# Patient Record
Sex: Male | Born: 1955
Health system: Southern US, Community
[De-identification: ages and names within clinical notes are randomized; demographics above are authoritative.]

## PROBLEM LIST (undated history)

## (undated) DIAGNOSIS — I639 Cerebral infarction, unspecified: Secondary | ICD-10-CM

## (undated) DIAGNOSIS — E785 Hyperlipidemia, unspecified: Secondary | ICD-10-CM

## (undated) DIAGNOSIS — I219 Acute myocardial infarction, unspecified: Secondary | ICD-10-CM

## (undated) DIAGNOSIS — I10 Essential (primary) hypertension: Secondary | ICD-10-CM

## (undated) HISTORY — DX: Acute myocardial infarction, unspecified: I21.9

## (undated) HISTORY — DX: Hyperlipidemia, unspecified: E78.5

## (undated) HISTORY — DX: Essential (primary) hypertension: I10

---

## 2000-01-16 ENCOUNTER — Encounter: Admission: RE | Admit: 2000-01-16 | Discharge: 2000-01-16 | Payer: Self-pay | Admitting: Family Medicine

## 2000-01-31 ENCOUNTER — Encounter: Admission: RE | Admit: 2000-01-31 | Discharge: 2000-01-31 | Payer: Self-pay | Admitting: Family Medicine

## 2000-02-06 ENCOUNTER — Encounter: Admission: RE | Admit: 2000-02-06 | Discharge: 2000-02-06 | Payer: Self-pay | Admitting: Family Medicine

## 2000-07-13 ENCOUNTER — Encounter: Admission: RE | Admit: 2000-07-13 | Discharge: 2000-07-13 | Payer: Self-pay | Admitting: Family Medicine

## 2000-07-13 ENCOUNTER — Ambulatory Visit (HOSPITAL_COMMUNITY): Admission: RE | Admit: 2000-07-13 | Discharge: 2000-07-13 | Payer: Self-pay | Admitting: Family Medicine

## 2000-10-24 ENCOUNTER — Encounter: Admission: RE | Admit: 2000-10-24 | Discharge: 2000-10-24 | Payer: Self-pay | Admitting: Family Medicine

## 2000-10-26 ENCOUNTER — Encounter: Admission: RE | Admit: 2000-10-26 | Discharge: 2000-10-26 | Payer: Self-pay | Admitting: Family Medicine

## 2000-10-30 ENCOUNTER — Encounter: Admission: RE | Admit: 2000-10-30 | Discharge: 2001-01-28 | Payer: Self-pay | Admitting: *Deleted

## 2000-11-02 ENCOUNTER — Encounter: Admission: RE | Admit: 2000-11-02 | Discharge: 2000-11-02 | Payer: Self-pay | Admitting: Family Medicine

## 2000-11-09 ENCOUNTER — Encounter: Admission: RE | Admit: 2000-11-09 | Discharge: 2000-11-09 | Payer: Self-pay | Admitting: Family Medicine

## 2000-11-23 ENCOUNTER — Encounter: Admission: RE | Admit: 2000-11-23 | Discharge: 2000-11-23 | Payer: Self-pay | Admitting: Family Medicine

## 2000-12-20 ENCOUNTER — Encounter: Admission: RE | Admit: 2000-12-20 | Discharge: 2000-12-20 | Payer: Self-pay | Admitting: Family Medicine

## 2001-05-10 ENCOUNTER — Encounter: Admission: RE | Admit: 2001-05-10 | Discharge: 2001-05-10 | Payer: Self-pay | Admitting: Family Medicine

## 2001-05-31 ENCOUNTER — Encounter: Admission: RE | Admit: 2001-05-31 | Discharge: 2001-05-31 | Payer: Self-pay | Admitting: Family Medicine

## 2001-08-26 ENCOUNTER — Encounter: Admission: RE | Admit: 2001-08-26 | Discharge: 2001-08-26 | Payer: Self-pay | Admitting: *Deleted

## 2001-09-30 ENCOUNTER — Encounter: Admission: RE | Admit: 2001-09-30 | Discharge: 2001-09-30 | Payer: Self-pay | Admitting: Family Medicine

## 2001-10-03 ENCOUNTER — Encounter: Admission: RE | Admit: 2001-10-03 | Discharge: 2001-10-03 | Payer: Self-pay | Admitting: Family Medicine

## 2002-11-21 ENCOUNTER — Encounter: Admission: RE | Admit: 2002-11-21 | Discharge: 2002-11-21 | Payer: Self-pay | Admitting: Sports Medicine

## 2003-12-03 ENCOUNTER — Ambulatory Visit: Payer: Self-pay | Admitting: Family Medicine

## 2004-09-13 ENCOUNTER — Ambulatory Visit: Payer: Self-pay | Admitting: Sports Medicine

## 2005-01-03 ENCOUNTER — Ambulatory Visit: Payer: Self-pay | Admitting: Family Medicine

## 2005-01-26 ENCOUNTER — Ambulatory Visit: Payer: Self-pay | Admitting: Family Medicine

## 2005-10-25 ENCOUNTER — Ambulatory Visit: Payer: Self-pay | Admitting: Family Medicine

## 2006-04-19 DIAGNOSIS — E119 Type 2 diabetes mellitus without complications: Secondary | ICD-10-CM | POA: Insufficient documentation

## 2006-04-19 DIAGNOSIS — E669 Obesity, unspecified: Secondary | ICD-10-CM | POA: Insufficient documentation

## 2006-04-19 DIAGNOSIS — E785 Hyperlipidemia, unspecified: Secondary | ICD-10-CM | POA: Insufficient documentation

## 2006-04-19 DIAGNOSIS — I10 Essential (primary) hypertension: Secondary | ICD-10-CM | POA: Insufficient documentation

## 2006-05-24 ENCOUNTER — Telehealth (INDEPENDENT_AMBULATORY_CARE_PROVIDER_SITE_OTHER): Payer: Self-pay | Admitting: Family Medicine

## 2006-07-04 ENCOUNTER — Telehealth: Payer: Self-pay | Admitting: *Deleted

## 2006-07-26 ENCOUNTER — Ambulatory Visit: Payer: Self-pay | Admitting: Family Medicine

## 2006-07-26 ENCOUNTER — Encounter (INDEPENDENT_AMBULATORY_CARE_PROVIDER_SITE_OTHER): Payer: Self-pay | Admitting: Family Medicine

## 2006-07-26 LAB — CONVERTED CEMR LAB
Bilirubin Urine: NEGATIVE
Blood in Urine, dipstick: NEGATIVE
CO2: 24 meq/L (ref 19–32)
Calcium: 9.9 mg/dL (ref 8.4–10.5)
Creatinine, Ser: 1.22 mg/dL (ref 0.40–1.50)
Direct LDL: 83 mg/dL
Ketones, urine, test strip: NEGATIVE
Nitrite: NEGATIVE
Sodium: 140 meq/L (ref 135–145)
Specific Gravity, Urine: 1.025

## 2006-11-16 ENCOUNTER — Telehealth (INDEPENDENT_AMBULATORY_CARE_PROVIDER_SITE_OTHER): Payer: Self-pay | Admitting: Family Medicine

## 2006-11-20 ENCOUNTER — Telehealth: Payer: Self-pay | Admitting: *Deleted

## 2007-01-22 ENCOUNTER — Ambulatory Visit: Payer: Self-pay | Admitting: Family Medicine

## 2007-01-22 LAB — CONVERTED CEMR LAB
Cholesterol, target level: 200 mg/dL
Hgb A1c MFr Bld: 6.6 %
LDL Goal: 100 mg/dL

## 2007-07-06 ENCOUNTER — Telehealth (INDEPENDENT_AMBULATORY_CARE_PROVIDER_SITE_OTHER): Payer: Self-pay | Admitting: Family Medicine

## 2007-10-30 ENCOUNTER — Telehealth: Payer: Self-pay | Admitting: *Deleted

## 2007-11-27 ENCOUNTER — Ambulatory Visit: Payer: Self-pay | Admitting: Family Medicine

## 2007-11-27 ENCOUNTER — Encounter (INDEPENDENT_AMBULATORY_CARE_PROVIDER_SITE_OTHER): Payer: Self-pay | Admitting: *Deleted

## 2007-12-03 ENCOUNTER — Telehealth: Payer: Self-pay | Admitting: *Deleted

## 2007-12-06 ENCOUNTER — Telehealth: Payer: Self-pay | Admitting: Family Medicine

## 2008-02-11 ENCOUNTER — Telehealth: Payer: Self-pay | Admitting: *Deleted

## 2008-04-03 ENCOUNTER — Ambulatory Visit: Payer: Self-pay | Admitting: Family Medicine

## 2008-04-03 ENCOUNTER — Inpatient Hospital Stay (HOSPITAL_COMMUNITY): Admission: AD | Admit: 2008-04-03 | Discharge: 2008-04-06 | Payer: Self-pay | Admitting: Family Medicine

## 2008-04-03 ENCOUNTER — Encounter: Payer: Self-pay | Admitting: Family Medicine

## 2008-04-03 ENCOUNTER — Encounter: Payer: Self-pay | Admitting: Emergency Medicine

## 2008-04-03 ENCOUNTER — Ambulatory Visit: Payer: Self-pay | Admitting: Diagnostic Radiology

## 2008-04-06 ENCOUNTER — Encounter: Payer: Self-pay | Admitting: Family Medicine

## 2008-04-07 ENCOUNTER — Telehealth: Payer: Self-pay | Admitting: *Deleted

## 2008-04-16 ENCOUNTER — Ambulatory Visit: Payer: Self-pay | Admitting: Family Medicine

## 2008-04-16 ENCOUNTER — Encounter: Payer: Self-pay | Admitting: Family Medicine

## 2008-05-01 ENCOUNTER — Telehealth: Payer: Self-pay | Admitting: Family Medicine

## 2008-05-12 ENCOUNTER — Telehealth: Payer: Self-pay | Admitting: *Deleted

## 2008-05-28 ENCOUNTER — Ambulatory Visit: Payer: Self-pay | Admitting: Internal Medicine

## 2008-06-11 ENCOUNTER — Ambulatory Visit: Payer: Self-pay | Admitting: Internal Medicine

## 2008-12-10 ENCOUNTER — Telehealth: Payer: Self-pay | Admitting: Family Medicine

## 2009-07-02 DIAGNOSIS — E876 Hypokalemia: Secondary | ICD-10-CM | POA: Insufficient documentation

## 2009-07-13 ENCOUNTER — Ambulatory Visit: Payer: Self-pay | Admitting: Internal Medicine

## 2009-07-13 DIAGNOSIS — R9431 Abnormal electrocardiogram [ECG] [EKG]: Secondary | ICD-10-CM | POA: Insufficient documentation

## 2009-09-27 ENCOUNTER — Ambulatory Visit: Payer: Self-pay | Admitting: Family Medicine

## 2009-09-27 ENCOUNTER — Telehealth: Payer: Self-pay | Admitting: Family Medicine

## 2009-09-27 DIAGNOSIS — B029 Zoster without complications: Secondary | ICD-10-CM | POA: Insufficient documentation

## 2009-11-17 ENCOUNTER — Encounter (INDEPENDENT_AMBULATORY_CARE_PROVIDER_SITE_OTHER): Payer: Self-pay | Admitting: *Deleted

## 2010-03-22 NOTE — Assessment & Plan Note (Signed)
Summary: np6/ok per heather   Visit Type:  Initial Consult Primary Provider:  Bobby Rumpf  MD  CC:  meds for HTN.  History of Present Illness: Kenyatte is a 55 y/o male with a h/o HTN, HL and DM2 presents for help with management of BP.  In Feb 2010 was admittd with hypoglycemia. ECG showed mild ST elevation and cardiac markers were mildly elevated. Had cardiac cath which was totally normal.   Doing well. Active without any CP or SOB. BP last year 190/95. Has been aggressively exercising and dieting and lost over 30 pounds. BP was still up so got frustrated and stopped checking it regularly.  Compliant with his medications.  No edema, orthopnea, BNP. Does an hour of cardio nearly every day. Has farily good HR response.    Current Medications (verified): 1)  Bayer Childrens Aspirin 81 Mg Chew (Aspirin) .... Take 1 Tablet By Mouth Once A Day 2)  Lisinopril 40 Mg Tabs (Lisinopril) .... Take 2 Tablet By Mouth Once A Day 3)  Metformin Hcl 1000 Mg Tabs (Metformin Hcl) .... Take 1 Tablet By Mouth Twice A Day 4)  Metoprolol Tartrate 100 Mg Tabs (Metoprolol Tartrate) .... Take 1 Tablet By Mouth Twice A Day 5)  Mevacor 40 Mg Tabs (Lovastatin) .... Take 1 Tablet By Mouth Once A Day 6)  Hydrochlorothiazide 25 Mg Tab (Hydrochlorothiazide) .... Take 1 Tablet By Mouth Once A Day 7)  Norvasc 10 Mg Tabs (Amlodipine Besylate) .Marland Kitchen.. 1 Tab By Mouth Qday 8)  Niacin 500 Mg Tabs (Niacin) .... Once Daily 9)  Multivitamins   Tabs (Multiple Vitamin) .... Once Daily 10)  Fish Oil   Oil (Fish Oil) .... 3  Daily  Allergies (verified): No Known Drug Allergies  Past History:  Past Medical History: 1. HTN 2. HLD 3. Obesity 4. DM2 5. Hypokalemia. 6. Abnormal ECG    --cardiac cath. normal LV and normal cors  Family History: Reviewed history from 11/27/2007 and no changes required. Father died in his 51`s of kidney dz., Mother has HTN, Diabetes. No Hx of prostate CA, colon CA.   Social History: Reviewed  history from 07/02/2009 and no changes required. Works at [employer]  Tobacco Use - No.  Alcohol Use - no Drug Use - no  Review of Systems       As per HPI and past medical history; otherwise all systems negative.   Vital Signs:  Patient profile:   55 year old male Height:      69.5 inches Weight:      233 pounds BMI:     34.04 Pulse rate:   60 / minute BP sitting:   134 / 72  (left arm) Cuff size:   large  Vitals Entered By: Hardin Negus, RMA (Jul 13, 2009 3:53 PM)   Impression & Recommendations:  Problem # 1:  HYPERTENSION, BENIGN SYSTEMIC (ICD-401.1) BP fairly well controlled. We did discuss changing metoprolol to carvedilol to continue BP control with less chrontropic effect (and possibly better glucose handling) but he seems to be doing well so we will continue. I have asked him to check BP and HR at home 3-4x week and bring [place] a log in several months to review. Spironolactone also an option in future.  Problem # 2:  ABNORMAL EKG (ICD-794.31) Coronaries by cath. We made him a copy of his ECG to keep in his wallet.   Other Orders: EKG w/ Interpretation (93000)  Patient Instructions: 1)  Follow up in 3-4 months

## 2010-03-22 NOTE — Assessment & Plan Note (Signed)
Summary: rash   Vital Signs:  Patient profile:   55 year old male Height:      69.5 inches Weight:      235.2 pounds BMI:     34.36 Temp:     97.9 degrees F oral Pulse rate:   60 / minute BP sitting:   123 / 77  (left arm) Cuff size:   regular  Vitals Entered By: Garen Grams LPN (September 27, 2009 9:54 AM) CC: rash on right side Is Patient Diabetic? Yes Did you bring your meter with you today? No Pain Assessment Patient in pain? no        Primary Care Provider:  Bobby Rumpf  MD  CC:  rash on right side.  History of Present Illness: R sided rash: was on vacation last week when developed R sided back pain.  persisted as an ache then yesterday started with a rash on this right side.  no longer painful.  no fever.  otherwise feeling well.  Habits & Providers  Alcohol-Tobacco-Diet     Tobacco Status: never  Current Medications (verified): 1)  Bayer Childrens Aspirin 81 Mg Chew (Aspirin) .... Take 1 Tablet By Mouth Once A Day 2)  Lisinopril 40 Mg Tabs (Lisinopril) .... Take 2 Tablet By Mouth Once A Day 3)  Metformin Hcl 1000 Mg Tabs (Metformin Hcl) .... Take 1 Tablet By Mouth Twice A Day 4)  Metoprolol Tartrate 100 Mg Tabs (Metoprolol Tartrate) .... Take 1 Tablet By Mouth Twice A Day 5)  Mevacor 40 Mg Tabs (Lovastatin) .... Take 1 Tablet By Mouth Once A Day 6)  Hydrochlorothiazide 25 Mg Tab (Hydrochlorothiazide) .... Take 1 Tablet By Mouth Once A Day 7)  Norvasc 10 Mg Tabs (Amlodipine Besylate) .Marland Kitchen.. 1 Tab By Mouth Qday 8)  Niacin 500 Mg Tabs (Niacin) .... Once Daily 9)  Multivitamins   Tabs (Multiple Vitamin) .... Once Daily 10)  Fish Oil   Oil (Fish Oil) .... 3  Daily 11)  Acyclovir 200 Mg Caps (Acyclovir) .... 4 Tabs By Mouth 5 Times Daily For 7 Days.  Disp Qs  Allergies (verified): No Known Drug Allergies  Past History:  Past medical, surgical, family and social histories (including risk factors) reviewed for relevance to current acute and chronic problems.  Past  Medical History: 1. HTN 2. HLD 3. Obesity 4. DM2 5. Hypokalemia. 6. Abnormal ECG    --cardiac cath. normal LV and normal cors  shingles August 2011  Family History: Reviewed history from 11/27/2007 and no changes required. Father died in his 2`s of kidney dz., Mother has HTN, Diabetes. No Hx of prostate CA, colon CA.   Social History: Reviewed history from 07/02/2009 and no changes required. Works at [employer]  Tobacco Use - No.  Alcohol Use - no Drug Use - no  Review of Systems       per HPI  Physical Exam  General:  obese and pleasant AAM, NAD VS noted - WNL Skin:  vesicular rash in various stages on along likely T5 or T6 dermatome on right. nontender.    Impression & Recommendations:  Problem # 1:  SHINGLES (ICD-053.9) Assessment New  since not painful at this time no rx to start today however given rx in case it develops pain.  o/w advised to avoid children <1 yr of age if possible.   Orders: [facility]- Est Level  3 (16109)  Complete Medication List: 1)  Bayer Childrens Aspirin 81 Mg Chew (Aspirin) .... Take 1 tablet  by mouth once a day 2)  Lisinopril 40 Mg Tabs (Lisinopril) .... Take 2 tablet by mouth once a day 3)  Metformin Hcl 1000 Mg Tabs (Metformin hcl) .... Take 1 tablet by mouth twice a day 4)  Metoprolol Tartrate 100 Mg Tabs (Metoprolol tartrate) .... Take 1 tablet by mouth twice a day 5)  Mevacor 40 Mg Tabs (Lovastatin) .... Take 1 tablet by mouth once a day 6)  Hydrochlorothiazide 25 Mg Tab (Hydrochlorothiazide) .... Take 1 tablet by mouth once a day 7)  Norvasc 10 Mg Tabs (Amlodipine besylate) .Marland Kitchen.. 1 tab by mouth qday 8)  Niacin 500 Mg Tabs (Niacin) .... Once daily 9)  Multivitamins Tabs (Multiple vitamin) .... Once daily 10)  Fish Oil Oil (Fish oil) .... 3  daily 11)  Acyclovir 200 Mg Caps (Acyclovir) .... 4 tabs by mouth 5 times daily for 7 days.  disp qs  Patient Instructions: 1)  Please fill the prescription if you start getting  worsening pain on your rash or burning in the area.  Call us if you start this prescription.  Prescriptions: ACYCLOVIR 200 MG CAPS (ACYCLOVIR) 4 tabs by mouth 5 times daily for 7 days.  Disp QS  #QS x 0   Entered and Authorized by:   Ancil Boozer  MD   Signed by:   Ancil Boozer  MD on 09/27/2009   Method used:   Handwritten   RxID:   [id number]

## 2010-03-22 NOTE — Letter (Signed)
Summary: Appointment - Missed  [facility], Main Office  [address]   [place], [place] [address]   Phone: [phone]  Fax: [phone]     November 17, 2009 MRN: [id number]   DARCY CORDNER [address] [place], [place]  [address]   Dear Mr. Kobel,  Our records indicate you missed your appointment on   11-11-2009 with  Dr.   Gala Romney   It is very important that we reach you to reschedule this appointment. We look forward to participating in your health care needs. Please contact us at the number listed above at your earliest convenience to reschedule this appointment.     Sincerely,      Lorne Skeens  [facility] Scheduling Team

## 2010-03-22 NOTE — Progress Notes (Signed)
Summary: triage  Phone Note Call from Patient Call back at Home Phone [phone]   Caller: Patient Summary of Call: just came back from trip and found rash all over - not sure if it's bedbugs and needs to talk to nurse Initial call taken by: De Nurse,  September 27, 2009 8:47 AM  Follow-up for Phone Call        he will see Dr. Sandi Mealy at 9:30 Follow-up by: Golden Circle RN,  September 27, 2009 8:48 AM

## 2010-04-19 ENCOUNTER — Other Ambulatory Visit: Payer: Self-pay | Admitting: Family Medicine

## 2010-04-20 NOTE — Telephone Encounter (Signed)
Please review and refill

## 2010-04-21 NOTE — Telephone Encounter (Signed)
Please review and refill for this patient Dr.Neal's.

## 2010-06-01 LAB — GLUCOSE, CAPILLARY: Glucose-Capillary: 89 mg/dL (ref 70–99)

## 2010-06-07 LAB — GLUCOSE, CAPILLARY
Glucose-Capillary: 111 mg/dL — ABNORMAL HIGH (ref 70–99)
Glucose-Capillary: 113 mg/dL — ABNORMAL HIGH (ref 70–99)
Glucose-Capillary: 116 mg/dL — ABNORMAL HIGH (ref 70–99)
Glucose-Capillary: 130 mg/dL — ABNORMAL HIGH (ref 70–99)
Glucose-Capillary: 140 mg/dL — ABNORMAL HIGH (ref 70–99)
Glucose-Capillary: 15 mg/dL — CL (ref 70–99)
Glucose-Capillary: 152 mg/dL — ABNORMAL HIGH (ref 70–99)
Glucose-Capillary: 193 mg/dL — ABNORMAL HIGH (ref 70–99)
Glucose-Capillary: 73 mg/dL (ref 70–99)
Glucose-Capillary: 56 mg/dL — ABNORMAL LOW (ref 70–99)
Glucose-Capillary: 66 mg/dL — ABNORMAL LOW (ref 70–99)

## 2010-06-07 LAB — COMPREHENSIVE METABOLIC PANEL
ALT: 68 U/L — ABNORMAL HIGH (ref 0–53)
AST: 25 U/L (ref 0–37)
Albumin: 3.4 g/dL — ABNORMAL LOW (ref 3.5–5.2)
Alkaline Phosphatase: 65 U/L (ref 39–117)
Alkaline Phosphatase: 74 U/L (ref 39–117)
BUN: 10 mg/dL (ref 6–23)
BUN: 8 mg/dL (ref 6–23)
BUN: 14 mg/dL (ref 6–23)
CO2: 31 mEq/L (ref 19–32)
CO2: 31 mEq/L (ref 19–32)
Calcium: 9.1 mg/dL (ref 8.4–10.5)
Calcium: 9.6 mg/dL (ref 8.4–10.5)
Creatinine, Ser: 1.17 mg/dL (ref 0.4–1.5)
GFR calc non Af Amer: >60 mL/min (ref >60)
Glucose, Bld: 111 mg/dL — ABNORMAL HIGH (ref 70–99)
Glucose, Bld: 172 mg/dL — ABNORMAL HIGH (ref 70–99)
Glucose, Bld: 68 mg/dL — ABNORMAL LOW (ref 70–99)
Sodium: 140 mEq/L (ref 135–145)
Sodium: 141 mEq/L (ref 135–145)
Total Bilirubin: 0.5 mg/dL (ref 0.3–1.2)
Total Bilirubin: 0.6 mg/dL (ref 0.3–1.2)
Total Protein: 6.2 g/dL (ref 6.0–8.3)
Total Protein: 6.5 g/dL (ref 6.0–8.3)

## 2010-06-07 LAB — CARDIAC PANEL(CRET KIN+CKTOT+MB+TROPI)
CK, MB: 3 ng/mL (ref 0.3–4.0)
CK, MB: 4.5 ng/mL — ABNORMAL HIGH (ref 0.3–4.0)
Relative Index: 1.2 (ref 0.0–2.5)
Total CK: 299 U/L — ABNORMAL HIGH (ref 7–232)
Total CK: 323 U/L — ABNORMAL HIGH (ref 7–232)
Troponin I: 0.21 ng/mL — ABNORMAL HIGH (ref 0.00–0.06)
Troponin I: 0.22 ng/mL — ABNORMAL HIGH (ref 0.00–0.06)

## 2010-06-07 LAB — URINE CULTURE

## 2010-06-07 LAB — BASIC METABOLIC PANEL
BUN: 15 mg/dL (ref 6–23)
CO2: 25 mEq/L (ref 19–32)
GFR calc non Af Amer: 57 mL/min — ABNORMAL LOW (ref >60)
Glucose, Bld: 118 mg/dL — ABNORMAL HIGH (ref 70–99)
Potassium: 3.4 mEq/L — ABNORMAL LOW (ref 3.5–5.1)
Sodium: 140 mEq/L (ref 135–145)

## 2010-06-07 LAB — CBC
HCT: 38.6 % — ABNORMAL LOW (ref 39.0–52.0)
HCT: 40.1 % (ref 39.0–52.0)
HCT: 40.3 % (ref 39.0–52.0)
HCT: 40.9 % (ref 39.0–52.0)
HCT: 43.5 % (ref 39.0–52.0)
Hemoglobin: 13.2 g/dL (ref 13.0–17.0)
Hemoglobin: 13.4 g/dL (ref 13.0–17.0)
Hemoglobin: 13.7 g/dL (ref 13.0–17.0)
Hemoglobin: 14 g/dL (ref 13.0–17.0)
MCHC: 33.5 g/dL (ref 30.0–36.0)
MCHC: 33.5 g/dL (ref 30.0–36.0)
MCHC: 33.6 g/dL (ref 30.0–36.0)
MCHC: 34.1 g/dL (ref 30.0–36.0)
MCHC: 32.2 g/dL (ref 30.0–36.0)
MCV: 79.4 fL (ref 78.0–100.0)
MCV: 79.7 fL (ref 78.0–100.0)
MCV: 79.9 fL (ref 78.0–100.0)
MCV: 80.2 fL (ref 78.0–100.0)
Platelets: 246 10*3/uL (ref 150–400)
Platelets: 248 10*3/uL (ref 150–400)
Platelets: 266 10*3/uL (ref 150–400)
RBC: 4.85 MIL/uL (ref 4.22–5.81)
RBC: 5.05 MIL/uL (ref 4.22–5.81)
RBC: 5.46 MIL/uL (ref 4.22–5.81)
RDW: 14.7 % (ref 11.5–15.5)
RDW: 15 % (ref 11.5–15.5)
RDW: 15 % (ref 11.5–15.5)
RDW: 15.3 % (ref 11.5–15.5)
RDW: 14.2 % (ref 11.5–15.5)
WBC: 5.6 10*3/uL (ref 4.0–10.5)

## 2010-06-07 LAB — CROSSMATCH: ABO/RH(D): B POS

## 2010-06-07 LAB — LIPID PANEL
Cholesterol: 131 mg/dL (ref 0–200)
LDL Cholesterol: 88 mg/dL (ref 0–99)

## 2010-06-07 LAB — POCT CARDIAC MARKERS
CKMB, poc: 6.4 ng/mL (ref 1.0–8.0)
Myoglobin, poc: 307 ng/mL (ref 12–200)

## 2010-06-07 LAB — URINALYSIS, ROUTINE W REFLEX MICROSCOPIC
Nitrite: NEGATIVE
Protein, ur: NEGATIVE mg/dL
Specific Gravity, Urine: 1.013 (ref 1.005–1.030)
Urobilinogen, UA: 1 mg/dL (ref 0.0–1.0)

## 2010-06-07 LAB — APTT: aPTT: 30 seconds (ref 24–37)

## 2010-06-07 LAB — HEMOGLOBIN A1C
Hgb A1c MFr Bld: 6.5 % — ABNORMAL HIGH (ref 4.6–6.1)
Mean Plasma Glucose: 140 mg/dL

## 2010-06-07 LAB — DIFFERENTIAL
Basophils Relative: 2 % — ABNORMAL HIGH (ref 0–1)
Eosinophils Absolute: 0.1 10*3/uL (ref 0.0–0.7)
Lymphs Abs: 1.4 10*3/uL (ref 0.7–4.0)
Neutro Abs: 3.9 10*3/uL (ref 1.7–7.7)
Neutrophils Relative %: 67 % (ref 43–77)

## 2010-07-05 ENCOUNTER — Other Ambulatory Visit: Payer: Self-pay | Admitting: Family Medicine

## 2010-07-05 NOTE — Discharge Summary (Signed)
Juan Kelly, Juan Kelly                 ACCOUNT NO.:  [id number]   MEDICAL RECORD NO.:  [id number]          PATIENT TYPE:  INP   LOCATION:  3713                         FACILITY:  [facility]   PHYSICIAN:  Santiago Bumpers. Hensel, M.D.DATE OF BIRTH:  Jan 21, 1956   DATE OF ADMISSION:  04/03/2008  DATE OF DISCHARGE:  04/06/2008                               DISCHARGE SUMMARY   PRIMARY CARE PHYSICIAN:  Bobby Rumpf, MD, at [facility].   DISCHARGE DIAGNOSES:  1. Hypoglycemia.  2. Non-ST-elevation myocardial infarction.  3. Rectal, subconjunctival bleeding.  4. Hypertension.  5. Diabetes mellitus type 2.  6. Hyperlipidemia.  7. Hypokalemia.   DISCHARGE MEDICATIONS:  1. Metformin 1000 mg p.o. b.i.d.  2. Norvasc 10 mg p.o. daily.  3. Niacin 500 mg p.o. nightly.  4. Zocor 40 mg p.o. nightly.  5. Hydrochlorothiazide 12.5 mg p.o. daily.  6. Lisinopril 20 mg p.o. daily.  7. Aspirin 325 mg p.o. daily.  8. Metoprolol 100 mg p.o. b.i.d.   DISCONTINUED MEDICATIONS:  Glyburide discontinued secondary to  hypoglycemic event.   CONSULTS:  Antonieta Iba, MD, at [facility].   LABORATORY DATA:  Labs on admission are as follows:  CBC with  differential within normal limits.  Electrolytes within normal limits.  AST 47, ALT 68, total protein 7.9, albumin 4.3, calcium 9.6.  Urinalysis  within normal limits with negative urine culture.  Initial hemoglobin  was 14.0 on admission.  Labs during admission, hemoglobin A1c 6.5.  Troponin I trended from 0.21 to 0.17 to 0.24 to 0.22.  CK-MB trended  from 6.0 to 4.5 to 3.4 to 3.0.  CK trended from 521 to 394 to 323 to  299.  TSH was within normal limits at 0.649.  Total cholesterol 131,  triglycerides 85, HDL 25, LDL 88.  Electrolytes on discharge were within  normal limits except for potassium of 3.4.  CBGs trended from 73 on  admission to a low of 56 to a high of 163 during the course of the  patient's  admission.   STUDIES:  1. Chest x-ray on April 03, 2008, showed no acute findings.  Mild      cardiac enlargement and unfolded aorta consistent with history of      hypertension.  2. CT head without contrast on April 03, 2008, showed no acute      intracranial abnormality, small old lacunar infarct in left      putamen.   PROCEDURES:  1. Cardiac catheterization on April 06, 2008, showed normal left      ventricular function, ejection fraction of 60%, normal coronary      arteries.  2. A 2-D echocardiogram from April 06, 2008, showed overall left      ventricular systolic function normal, left ventricular ejection      fraction estimated to be 60%.  No wall motion abnormalities.  Left      ventricular wall thickness markedly increased.  Increased relative      contribution of atrial contraction to left ventricular filling.      Thought  the parameters consistent with abnormal left ventricular      relaxation.  Aortic valve thickness mildly increased.  Left atrium      mildly dilated.   BRIEF SYNOPSIS:  This is a 55 year old with history of hypertension,  hyperlipidemia, and diabetes mellitus type 2 who presented to the  emergency room with hypoglycemia and altered mental status, as well as  elevated troponin with EKG changes without chest pain.  The patient  presented asymptomatic.  1. Elevated troponins with EKG changes.  The patient completely      asymptomatic on presentation and for hospital course.  The patient      with elevated troponins as described above.  The patient with      significant factors including diabetes mellitus type 2, history of      hyperlipidemia, history of hypertension.  The patient was initially      seen by Cardiology.  The patient was continued on beta-blocker,      started on full-dose Lovenox and had his aspirin increased to 325      mg daily.  Norvasc was also added for blood pressure control.  The      patient was also started on  statin.  The patient with EKG initially      with nonspecific T-wave changes in leads A, 1, and aVL; however, on      April 04, 2008, developed ST-segment elevation 1-2 mm in lateral      leads which was new.  Given these changes and elevated troponins,      there was concern for ACS.  Cardiology was reconsulted and decision      was made to start the patient on Integrilin prior to performing      cath.  The patient developed some mucosal bleeding with Integrilin      and this was stopped.  The patient was started on low-dose heparin      which was then held for cardiac cath.  The patient had cardiac cath      as described above which was negative, and the patient was then      discharged post-cardiac catheterization with plan to continue      primary prevention.  It was felt that the patient's elevated      troponins and ST changes were secondary to hypoglycemia.  2. Hypoglycemia.  The patient initially presented with blood glucose      as described above.  The patient was on glyburide in the outpatient      setting as well as metformin.  The patient's glyburide was held and      will be stopped prior to discharge.  The patient's A1c is as above.      The patient's CBGs gradually normalized, and the patient was      continued on metformin initially during the course of his      hospitalization.  The patient's metformin was then held prior to      starting cardiac catheterization.  The patient's metformin will be      restarted on discharge.  3. Hypertension.  The patient was continued on his home dose      metoprolol during the course of his hospitalization.  The patient's      hydrochlorothiazide and lisinopril were initially held with plan      for cardiac catheterization.  These will be restarted on discharge.      The patient was started on Norvasc during the course of  hospitalization initially at 5 mg p.o. daily, this was then      increased to 10 mg p.o. daily.  The patient  is normotensive on      discharge.  4. Diabetes mellitus type 2.  The patient admitted for hypoglycemia as      described above.  The patient's glyburide will be held on      discharge.  The patient's CBGs normalized as described above.  The      patient will be discharged home on metformin.  The patient's A1c as      described above.  5. Hyperlipidemia.  The patient will be discharged on Zocor and niacin      given his fasting lipid panel as above.  The patient will be      discharged to home in stable and improved condition.  The patient      will follow up with Dr. Wallene Huh at [facility] on      April 16, 2008, at 4:00 p.m.      Bobby Rumpf, MD  Electronically Signed      Santiago Bumpers. Leveda Anna, M.D.  Electronically Signed    KC/MEDQ  D:  04/07/2008  T:  04/08/2008  Job:  [id number]

## 2010-07-05 NOTE — Cardiovascular Report (Signed)
[name], [name]                 ACCOUNT NO.:  [id number]   MEDICAL RECORD NO.:  [id number]          PATIENT TYPE:  INP   LOCATION:  3713                         FACILITY:  [facility]   PHYSICIAN:  Cristy Hilts. Jacinto Halim, MD       DATE OF BIRTH:  [birth year]   DATE OF PROCEDURE:  04/06/2008  DATE OF DISCHARGE:  04/06/2008                            CARDIAC CATHETERIZATION   PROCEDURE PERFORMED:  1. Left ventriculography.  2. Selective right and left coronary arteriography.  3. Abdominal aortogram.   INDICATIONS:  Mr. Juan Kelly who is a 55 year old gentleman with  hypertension, uncontrolled diabetes, hyperlipidemia, and obesity, who  was admitted to the hospital with confusional state.  He was found to  have markedly abnormal EKG with transient ST-segment elevations.  The  cardiac markers were positive for myocardial injury.  He is now brought  to the cardiac catheterization lab to reevaluate his coronary anatomy.   HEMODYNAMIC DATA:  The left ventricular pressure was 164/4 with an end-  diastolic pressure of 15 mmHg.  The aortic pressure was 159/90 with a  mean of 119 mmHg.  There was no pressure gradient across the aortic  valve.   ANGIOGRAPHIC DATA:  Left ventricle:  Left ventricular systolic function  was normal with an ejection fraction of 55-60% with no regional wall  motion abnormality.  No significant mitral regurgitation.   Right coronary artery:  The right coronary artery is a large dominant  vessel.  It is smooth and normal.  It gives rise to a large PDA and  large PLF.   Left main coronary artery:  Left main coronary artery is large caliber  vessel. Normal.  Circumflex:  Circumflex artery is a large caliber vessel that gives rise  to a large obtuse marginal.  It is smooth and normal.   LAD:  LAD is a large caliber vessel wraps distally to apex.  It gives  rise to two moderate-to-large-sized diagonals.  It is smooth and normal.   Abdominal aortogram.  Abdominal aortogram  revealed presence of two renal  arteries, one on either side.  They were widely patent.  There was no  evidence of abdominal aortic aneurysm.   The aortoiliac bifurcation was widely patent.   IMPRESSION AND PLAN:  Mr. Juan Kelly is a 55 year old gentleman with  hypertension with positive cardiac markers, abnormal EKG.  I suspect  this is not related to hypertension with hypertensive heart disease .  He has normal coronary arteries by cardiac catheterization.  We will  continue with primary prevention.  A total of 95 mL of contrast was  utilized for diagnostic angiography.   TECHNIQUE OF PROCEDURE:  Under usual sterile precautions using a 6-  French right femoral arterial access, 6-French multipurpose B2 catheter  was advanced into the ascending aorta and then to the left ventricle.  Left ventriculography was performed both in LAO and RAO projection.  Catheter pulled into the ascending aorta.  Right  coronary artery selectively engaged and angiography was performed.  The  catheter was then pulled back to abdominal aorta and abdominal aortogram  was  performed.  Catheter pulled out of body.  The patient tolerated the  procedure well.      Cristy Hilts. Jacinto Halim, MD  Electronically Signed     JRG/MEDQ  D:  04/06/2008  T:  04/07/2008  Job:  [id number]

## 2010-07-05 NOTE — H&P (Signed)
NAMESTEWARD, SAMES                 ACCOUNT NO.:  [id number]   MEDICAL RECORD NO.:  [id number]          PATIENT TYPE:  INP   LOCATION:  3715                         FACILITY:  [facility]   PHYSICIAN:  Pearlean Brownie, M.D.DATE OF BIRTH:  [birth year]   DATE OF ADMISSION:  04/03/2008  DATE OF DISCHARGE:                              HISTORY & PHYSICAL   PRIMARY CARE Kambrey Hagger:  Bobby Rumpf, MD   CHIEF COMPLAINT:  Elevated troponins with EKG changes.   HISTORY OF PRESENT ILLNESS:  This is a 55 year old African American male  with hypertension, hyperlipidemia, diabetes, who we are asked to admit  as a direct transfer from [facility] ED for elevated troponin  with EKG changes.  The patient initially went to the ED this morning for  altered mental status.  He reports his CBGs were in the 30s last night  prior to bed but he felt fine and they came up usually easily with food.  He woke this morning feeling normal and was taking to his friend on the  phone when he started to have slurred speech that was not making sense.  The friend called back and spoke to a family member who advised that he  be taken to the emergency department to be checked out further.  The  patient did check a CBG at that time but he ate breakfast and felt at  his baseline.  In the emergency department, his initial CBG was 79 but  it did continue to drop into the 50s and 60s periodically but he was not  having any altered mental status or slurred speech at that time and the  neuro exam was completely normal in the emergency department.  The  patient denied any chest pain or shortness of breath but was noted to be  hypertensive to the 190s systolic and point of cares were drawn that  showed troponin elevated at 0.12 with elevated myoglobin.  EKG showed  some nonspecific T-wave changes in lead I and aVL.  The ED physician  contacted Cardiology on call and was advised to admit the patient for  cardiac rule out and  obtain an echo but due to the hyperglycemia, they  advised we admit and consult them.   Upon arrival to [facility], the patient reports he has been  neurologically intact since that one episode this morning.  He said he  did not even notice anything wrong at that time as above.  He felt  coherent and normal throughout without any weakness or numbness.  He has  felt normal without slurred speech all day long.  He has been exercising  and on a strict diet for the last month with a 10-pound weight loss with  no changes to his diabetic medications.  He has not followed up in  clinic since last fall.  He denies any chest pain, shortness of breath,  dyspnea on exertion, peripheral edema or orthopnea.  He exercises 1 hour  daily of cardio without any of these symptoms either.  He denies  syncope.  He has no cardiac issue  or history.  Of note, he reports not  taking any antihypertensives this a.m.   REVIEW OF SYSTEMS:  Negative except for above.   PAST MEDICAL HISTORY:  Significant for:  1. Diabetes.  2. Hypertension.  3. Hyperlipidemia.  4. Obesity.   ALLERGIES:  None.   MEDICATIONS:  1. Aspirin 81 mg p.o. daily.  2. Glyburide 5 mg one b.i.d.  3. Lisinopril 40 mg 2 tablets daily.  4. Metformin 1000 mg p.o. b.i.d.  5. Metoprolol 100 mg 2 tablets p.o. b.i.d.  6. Mevacor 40 mg 1 tablet daily.  7. Hydrochlorothiazide 25 mg 1 tablet daily.   SOCIAL HISTORY:  The patient works in Engineer, drilling.  He does not  smoke, does not drink alcohol and denies illicit drugs.  He is single.   FAMILY HISTORY:  Father died at his 52s of kidney disease.  Mother has  hypertension and diabetes.  He has no history of prostate cancer or  colon cancer in the family.   PHYSICAL EXAMINATION:  VITAL SIGNS:  Temperature is 97.6.  Blood  pressure is 157/97, respirations 20, pulse is 63.  He is 100% on room  air.  GENERAL:  This is an obese and pleasant African American male in no  apparent  distress sitting up in bed eating dinner.  HEENT:  Normocephalic, atraumatic.  Pupils equal, round and reactive to  light.  Extraocular movements intact.  Moist mucous membranes.  NECK:  Supple.  Full range of motion.  No masses.  No JVD.  LUNGS:  Normal respiratory effort.  Clear to auscultation bilaterally.  No wheezes, rhonchi or crackles.  CARDIOVASCULAR:  Regular rate and rhythm.  No murmurs, rubs or gallops.  2+ radial and dorsalis pedis pulses bilaterally.  ABDOMEN:  Positive  bowel sounds, soft, nontender, nondistended.  EXTREMITIES:  No edema.  NEURO:  Alert and oriented x3.  Cranial nerves II-XII intact.  Strength  5/5 in all extremities.  Sensation in all extremities intact.  Normal  gait.  DTRs symmetric and normal.  SKIN:  Good turgor and no rashes.   LABORATORY DATA:  Urinalysis negative and a culture is currently  pending.  White blood cell count is 5.8, hemoglobin 14.0, and platelets  275.  Sodium 141, potassium 3.5, chloride 101, bicarb 31, BUN 16,  creatinine 1.2, glucose 68, ALT 60, AST 47 and total protein 7.9.  Point-  of-care enzymes showed a troponin of 0.12 on the first set and 0.10 on  the second set.  Head CT showed a small old lacunar infarct of the left  putamen but nothing acute.  EKG showed some nonspecific T-wave changes  compared to prior.   ASSESSMENT AND PLAN:  This is a 55 year old pleasant obese African  American male with hypertension, hyperlipidemia, type 2 diabetes with  hypoglycemia and elevated troponins with nonspecific EKG changes.  1. Hypoglycemia.  This is likely the cause of the garbled speech this      morning as it is completely resolved with restoration of CBGs.      Hypoglycemia from glyburide on a new diet, exercise program without      follow up with the primary care Miguel Medal is likely the cause.  We      will hold glyburide and his metformin pending further cardiac      workup and follow his CBG.  He will need sliding-scale  insulin      p.r.n. and likely need a change of the glyburide dose versus stop  it completely at discharge.  2. Cardiac totally asymptomatic.  Cardiology is still being consulted      for elevated troponins but I think this is likely secondary to the      hypertension that was catecholamine response from the prolonged      hypoglycemia.  Still he has significant risk factors so further      testing is warranted.  We will place him on telemetry, cycle his      enzymes and then obtain a 2-D echo.  Start him on aspirin and beta      blocker and follow up Cardiology recommendations.  3. Hypertension is elevated currently but likely related to a stress      response from the hypoglycemia.  He reports not taking his blood      pressure medications.  Today, we will continue his      hydrochlorothiazide and his beta blocker but hold the lisinopril in      case he needs further cardiac workup and diet studies.  Could      consider using a calcium channel blocker p.r.n.  4. Fluid, electrolytes and nutrition, GI: Hep-Lock IV, diabetic heart-      healthy diet and follow his electrolytes.  5. Prophylaxis, diet and Lovenox.   DISPOSITION:  Pending Cardiology's workup and resolution of a normal  CBG.      Lupita Raider, M.D.  Electronically Signed      Pearlean Brownie, M.D.  Electronically Signed    KS/MEDQ  D:  04/03/2008  T:  04/04/2008  Job:  [id number]

## 2010-07-06 ENCOUNTER — Other Ambulatory Visit: Payer: Self-pay | Admitting: Family Medicine

## 2010-07-06 NOTE — Telephone Encounter (Signed)
Refill request

## 2010-12-22 ENCOUNTER — Other Ambulatory Visit: Payer: Self-pay | Admitting: Family Medicine

## 2010-12-22 MED ORDER — METFORMIN HCL 1000 MG PO TABS: 1000.0000 mg | ORAL_TABLET | Freq: Two times a day (BID) | ORAL | Status: DC

## 2010-12-22 MED ORDER — LISINOPRIL 40 MG PO TABS: 40.0000 mg | ORAL_TABLET | Freq: Every day | ORAL | Status: DC

## 2011-01-26 ENCOUNTER — Other Ambulatory Visit: Payer: Self-pay | Admitting: Family Medicine

## 2011-01-26 MED ORDER — HYDROCHLOROTHIAZIDE 25 MG PO TABS: 25.0000 mg | ORAL_TABLET | Freq: Every day | ORAL | Status: DC

## 2011-01-31 ENCOUNTER — Other Ambulatory Visit: Payer: Self-pay | Admitting: Family Medicine

## 2011-01-31 MED ORDER — LISINOPRIL 40 MG PO TABS: 40.0000 mg | ORAL_TABLET | Freq: Every day | ORAL | Status: DC

## 2011-02-23 ENCOUNTER — Ambulatory Visit: Payer: Self-pay | Admitting: Family Medicine

## 2011-03-07 ENCOUNTER — Ambulatory Visit: Payer: Self-pay | Admitting: Family Medicine

## 2011-03-21 ENCOUNTER — Ambulatory Visit: Payer: Self-pay | Admitting: Family Medicine

## 2011-03-31 ENCOUNTER — Encounter: Payer: Self-pay | Admitting: Family Medicine

## 2011-03-31 ENCOUNTER — Ambulatory Visit (INDEPENDENT_AMBULATORY_CARE_PROVIDER_SITE_OTHER): Payer: Self-pay | Admitting: Family Medicine

## 2011-03-31 VITALS — BP 148/76 | HR 73 | Temp 99.1°F | Ht 69.5 in | Wt 263.0 lb

## 2011-03-31 DIAGNOSIS — E785 Hyperlipidemia, unspecified: Secondary | ICD-10-CM

## 2011-03-31 DIAGNOSIS — Z23 Encounter for immunization: Secondary | ICD-10-CM

## 2011-03-31 DIAGNOSIS — E119 Type 2 diabetes mellitus without complications: Secondary | ICD-10-CM

## 2011-03-31 LAB — POCT GLYCOSYLATED HEMOGLOBIN (HGB A1C): Hemoglobin A1C: 7.3

## 2011-03-31 MED ORDER — METFORMIN HCL 1000 MG PO TABS: 1000.0000 mg | ORAL_TABLET | Freq: Two times a day (BID) | ORAL | Status: DC

## 2011-03-31 MED ORDER — HYDROCHLOROTHIAZIDE 25 MG PO TABS: 25.0000 mg | ORAL_TABLET | Freq: Every day | ORAL | Status: DC

## 2011-03-31 MED ORDER — METOPROLOL TARTRATE 100 MG PO TABS: 100.0000 mg | ORAL_TABLET | Freq: Two times a day (BID) | ORAL | Status: DC

## 2011-03-31 MED ORDER — NIACIN ER 500 MG PO CPCR: 500.0000 mg | ORAL_CAPSULE | Freq: Every day | ORAL | Status: AC

## 2011-03-31 MED ORDER — LISINOPRIL 40 MG PO TABS: 40.0000 mg | ORAL_TABLET | Freq: Every day | ORAL | Status: DC

## 2011-03-31 MED ORDER — AMLODIPINE BESYLATE 10 MG PO TABS: 10.0000 mg | ORAL_TABLET | Freq: Every day | ORAL | Status: DC

## 2011-03-31 NOTE — Patient Instructions (Signed)
I will call you with the results of your lab work I will likely have to call in the cholesterol medicine. Please come back and see me in 3 months Please make appointment to see your eye doctor.

## 2011-03-31 NOTE — Progress Notes (Signed)
Addended by: Garen Grams F on: 03/31/2011 10:54 AM   Modules accepted: Orders

## 2011-03-31 NOTE — Progress Notes (Signed)
  Subjective:    Patient ID: Juan Kelly, male    DOB: 03-Nov-1955, 56 y.o.   MRN: [id number]  HPI  HYPERTENSION Disease Monitoring Blood pressure range-was 120/80 when he was on the lisinopril per patient Chest pain- none      Dyspnea- none Medications Compliance- taking all except lisinopril because this was not filled when he did not follow his appointment schedule Lightheadedness- none   Edema- none   DIABETES Disease Monitoring Blood Sugar ranges-rarely checks but they're usually 110 random blood sugar Polyuria- none New Visual problems- none patient saw his eye doctor 3 months ago. Medications Compliance- taking metformin once a day Hypoglycemic symptoms- none   HYPERLIPIDEMIA Disease Monitoring See symptoms for Hypertension Medications Compliance- not taking statin RUQ pain- none  Muscle aches- none  ROS See HPI above   PMH Smoking Status noted    Review of Systems No diarrhea, constipation    Objective:   Physical Exam Vital signs reviewed General appearance - alert, well appearing, and in no distress and oriented to person, place, and time Heart - normal rate, regular rhythm, normal S1, S2, no murmurs, rubs, clicks or gallops Chest - clear to auscultation, no wheezes, rales or rhonchi, symmetric air entry, no tachypnea, retractions or cyanosis Abdomen - soft, nontender, nondistended, no masses or organomegaly Feet examined Extremities - peripheral pulses normal, no pedal edema, no clubbing or cyanosis        Assessment & Plan:

## 2011-04-01 LAB — COMPREHENSIVE METABOLIC PANEL
Albumin: 4.3 g/dL (ref 3.5–5.2)
CO2: 24 mEq/L (ref 19–32)
Calcium: 9.6 mg/dL (ref 8.4–10.5)
Chloride: 104 mEq/L (ref 96–112)
Glucose, Bld: 258 mg/dL — ABNORMAL HIGH (ref 70–99)
Potassium: 3.6 mEq/L (ref 3.5–5.3)
Sodium: 142 mEq/L (ref 135–145)
Total Protein: 7.2 g/dL (ref 6.0–8.3)

## 2011-04-01 LAB — LIPID PANEL: Cholesterol: 224 mg/dL — ABNORMAL HIGH (ref 0–200)

## 2011-04-03 ENCOUNTER — Telehealth: Payer: Self-pay | Admitting: Family Medicine

## 2011-04-03 ENCOUNTER — Other Ambulatory Visit: Payer: Self-pay | Admitting: Family Medicine

## 2011-04-03 DIAGNOSIS — E785 Hyperlipidemia, unspecified: Secondary | ICD-10-CM

## 2011-04-03 MED ORDER — PRAVASTATIN SODIUM 40 MG PO TABS: 40.0000 mg | ORAL_TABLET | Freq: Every day | ORAL | Status: DC

## 2011-04-03 NOTE — Telephone Encounter (Signed)
Called to advise should be on a statin due to elevated cholesterol.  Sent in pravachol

## 2012-04-17 ENCOUNTER — Telehealth: Payer: Self-pay | Admitting: Family Medicine

## 2012-04-17 NOTE — Telephone Encounter (Signed)
Patient called for refills on all of his meds.  He was last seen by Dr. Hulen Luster last February so he was told he would need to be seen before he could get refills.  He asked me if he was supposed to die between now and then so I let him know that if we schedule him, the doctor may be willing to send in enough meds to last until that appt.  Patient is scheduled to be seen on 4/1 but needs enough of his meds to last until then.  He needs Pravastatin, Amlodipine, Hydrochlorothiazide, Metformin, Metoprolol, Niacin and Lisinopril sent to [facility] on [address]. Before hanging up with him, I meant to ask him to make sure he schedules his appt at least a month before his refills will end, but since this wasn't relayed to him, when the nurse calls him back, I would appreciate this being relayed at that time.

## 2012-04-18 ENCOUNTER — Other Ambulatory Visit: Payer: Self-pay | Admitting: *Deleted

## 2012-04-18 NOTE — Telephone Encounter (Signed)
Will forward to Dr Sonnenberg 

## 2012-04-18 NOTE — Telephone Encounter (Signed)
Pt is calling again and he has rescheduled appt to 3/11 and needs these meds called in until that appt - he is calling back at 4:30 today to see if this was able to be handled.

## 2012-04-19 MED ORDER — METFORMIN HCL 1000 MG PO TABS: 1000.0000 mg | ORAL_TABLET | Freq: Two times a day (BID) | ORAL | Status: DC

## 2012-04-19 MED ORDER — LISINOPRIL 40 MG PO TABS: 40.0000 mg | ORAL_TABLET | Freq: Every day | ORAL | Status: DC

## 2012-04-19 MED ORDER — METOPROLOL TARTRATE 100 MG PO TABS: 100.0000 mg | ORAL_TABLET | Freq: Two times a day (BID) | ORAL | Status: DC

## 2012-04-19 MED ORDER — HYDROCHLOROTHIAZIDE 25 MG PO TABS: 25.0000 mg | ORAL_TABLET | Freq: Every day | ORAL | Status: DC

## 2012-04-19 NOTE — Telephone Encounter (Signed)
Refilled medications. Will call patient in the morning to ensure that he is able to get them.

## 2012-04-19 NOTE — Addendum Note (Signed)
Addended by: Glori Luis on: 04/19/2012 10:36 PM   Modules accepted: Orders

## 2012-04-21 NOTE — Telephone Encounter (Signed)
Called to let patient know his medications had been refilled. Left voicemail.

## 2012-04-23 ENCOUNTER — Telehealth: Payer: Self-pay | Admitting: *Deleted

## 2012-04-23 MED ORDER — NIACIN ER 500 MG PO CPCR: 500.0000 mg | ORAL_CAPSULE | Freq: Every day | ORAL | Status: DC

## 2012-04-23 NOTE — Telephone Encounter (Signed)
Refilled patients niacin. Please inform patient that this was sent to pharmacy.

## 2012-04-23 NOTE — Telephone Encounter (Signed)
Refill request for Niacin. Placed in MD box.

## 2012-04-30 ENCOUNTER — Ambulatory Visit: Payer: Self-pay | Admitting: Family Medicine

## 2012-05-07 ENCOUNTER — Other Ambulatory Visit: Payer: Self-pay | Admitting: *Deleted

## 2012-05-07 DIAGNOSIS — E785 Hyperlipidemia, unspecified: Secondary | ICD-10-CM

## 2012-05-07 MED ORDER — PRAVASTATIN SODIUM 40 MG PO TABS: 40.0000 mg | ORAL_TABLET | Freq: Every day | ORAL | Status: DC

## 2012-05-07 NOTE — Telephone Encounter (Signed)
Refilling patients pravastatin. Patient must come to next appointment prior to anymore refills.

## 2012-05-08 NOTE — Telephone Encounter (Signed)
Pt already has appt on April 1st, but i reminded him about it when i called. Jaelyne Deeg, Maryjo Rochester

## 2012-05-21 ENCOUNTER — Ambulatory Visit: Payer: Self-pay | Admitting: Family Medicine

## 2012-08-14 ENCOUNTER — Other Ambulatory Visit: Payer: Self-pay | Admitting: Family Medicine

## 2012-08-14 ENCOUNTER — Other Ambulatory Visit: Payer: Self-pay | Admitting: *Deleted

## 2012-08-14 DIAGNOSIS — E785 Hyperlipidemia, unspecified: Secondary | ICD-10-CM

## 2012-08-14 NOTE — Telephone Encounter (Signed)
Patient has not been seen in over a year. Has cancelled one recent appointment and no showed to another appointment. He needs to be seen in clinic prior to having any medications refilled as his health status has changed and could require changes in dosing of medications.

## 2012-08-15 NOTE — Telephone Encounter (Signed)
Patient is calling because he needs enough of his meds to last until his appt on 7/8.  Then after that, he wants 90 day supply.

## 2012-08-15 NOTE — Telephone Encounter (Signed)
Message left with family member regarding need for appointment prior to refilling meds. Wyatt Haste, RN-BSN

## 2012-08-16 MED ORDER — PRAVASTATIN SODIUM 40 MG PO TABS: 40.0000 mg | ORAL_TABLET | Freq: Every day | ORAL | Status: DC

## 2012-08-16 MED ORDER — METFORMIN HCL 1000 MG PO TABS: 1000.0000 mg | ORAL_TABLET | Freq: Two times a day (BID) | ORAL | Status: DC

## 2012-08-16 MED ORDER — LISINOPRIL 40 MG PO TABS: 40.0000 mg | ORAL_TABLET | Freq: Every day | ORAL | Status: DC

## 2012-08-16 MED ORDER — METOPROLOL TARTRATE 100 MG PO TABS: 100.0000 mg | ORAL_TABLET | Freq: Two times a day (BID) | ORAL | Status: DC

## 2012-08-16 MED ORDER — HYDROCHLOROTHIAZIDE 25 MG PO TABS: 25.0000 mg | ORAL_TABLET | Freq: Every day | ORAL | Status: DC

## 2012-08-16 NOTE — Telephone Encounter (Signed)
Pt advised meds sent to pharmacy x 12days until appt on 7/8

## 2012-08-16 NOTE — Telephone Encounter (Signed)
Patient given 12 day supply to get him until his appointment on 7/8.

## 2012-08-26 ENCOUNTER — Telehealth: Payer: Self-pay | Admitting: *Deleted

## 2012-08-26 ENCOUNTER — Other Ambulatory Visit: Payer: Self-pay | Admitting: *Deleted

## 2012-08-26 MED ORDER — NIACIN ER 500 MG PO CPCR: 500.0000 mg | ORAL_CAPSULE | Freq: Every day | ORAL | Status: DC

## 2012-08-26 NOTE — Telephone Encounter (Signed)
Refilled Niacin. Patient will need to come to f/u appointment prior to additional refills.

## 2012-08-26 NOTE — Telephone Encounter (Signed)
Returned pt message and confirmed appt for 9 am on 7/8 Wyatt Haste, RN-BSN

## 2012-08-27 ENCOUNTER — Ambulatory Visit (INDEPENDENT_AMBULATORY_CARE_PROVIDER_SITE_OTHER): Payer: BC Managed Care – PPO | Admitting: Family Medicine

## 2012-08-27 ENCOUNTER — Encounter: Payer: Self-pay | Admitting: Family Medicine

## 2012-08-27 VITALS — BP 138/78 | HR 65 | Temp 99.4°F | Ht 69.5 in | Wt 260.0 lb

## 2012-08-27 DIAGNOSIS — I10 Essential (primary) hypertension: Secondary | ICD-10-CM

## 2012-08-27 DIAGNOSIS — E785 Hyperlipidemia, unspecified: Secondary | ICD-10-CM

## 2012-08-27 DIAGNOSIS — E119 Type 2 diabetes mellitus without complications: Secondary | ICD-10-CM

## 2012-08-27 LAB — COMPREHENSIVE METABOLIC PANEL
ALT: 28 U/L (ref 0–53)
BUN: 15 mg/dL (ref 6–23)
CO2: 26 mEq/L (ref 19–32)
Calcium: 9.8 mg/dL (ref 8.4–10.5)
Chloride: 102 mEq/L (ref 96–112)
Creat: 1.07 mg/dL (ref 0.50–1.35)
Glucose, Bld: 284 mg/dL — ABNORMAL HIGH (ref 70–99)

## 2012-08-27 LAB — POCT GLYCOSYLATED HEMOGLOBIN (HGB A1C): Hemoglobin A1C: 8.9

## 2012-08-27 LAB — LIPID PANEL
Cholesterol: 181 mg/dL (ref 0–200)
LDL Cholesterol: 124 mg/dL — ABNORMAL HIGH (ref 0–99)
Triglycerides: 99 mg/dL (ref <150)

## 2012-08-27 MED ORDER — LISINOPRIL 40 MG PO TABS: 40.0000 mg | ORAL_TABLET | Freq: Every day | ORAL | Status: DC

## 2012-08-27 MED ORDER — PRAVASTATIN SODIUM 40 MG PO TABS: 40.0000 mg | ORAL_TABLET | Freq: Every day | ORAL | Status: DC

## 2012-08-27 MED ORDER — METOPROLOL TARTRATE 100 MG PO TABS: 100.0000 mg | ORAL_TABLET | Freq: Two times a day (BID) | ORAL | Status: DC

## 2012-08-27 MED ORDER — METFORMIN HCL 1000 MG PO TABS: 1000.0000 mg | ORAL_TABLET | Freq: Two times a day (BID) | ORAL | Status: DC

## 2012-08-27 MED ORDER — AMLODIPINE BESYLATE 10 MG PO TABS: 10.0000 mg | ORAL_TABLET | Freq: Every day | ORAL | Status: DC

## 2012-08-27 MED ORDER — HYDROCHLOROTHIAZIDE 25 MG PO TABS: 25.0000 mg | ORAL_TABLET | Freq: Every day | ORAL | Status: DC

## 2012-08-27 MED ORDER — NIACIN ER 500 MG PO CPCR: 500.0000 mg | ORAL_CAPSULE | Freq: Every day | ORAL | Status: DC

## 2012-08-27 NOTE — Assessment & Plan Note (Addendum)
LDL 124. Want below 100. Will increase pravastatin to 80 mg daily. Continue niacin.

## 2012-08-27 NOTE — Assessment & Plan Note (Addendum)
At goal. Plan: Reordered amlodipine, HCTZ, lisinopril, and metoprolol. Will check CMET.
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
## 2012-08-27 NOTE — Patient Instructions (Signed)
Nice to meet you. Your hemoglobin A1c, the long term test for your diabetes, is a little up. Please start taking you metformin twice a day. Continue to take your blood pressure medications and cholesterol medications. We will call you with your lab results.

## 2012-08-27 NOTE — Assessment & Plan Note (Signed)
A1c elevated from last check. Patient not compliant with meds. Plan: to take metformin as prescribed twice a day. Also states is going to start walking and then running in training for 5K. Going to eat better as well.

## 2012-08-29 ENCOUNTER — Other Ambulatory Visit: Payer: Self-pay

## 2012-08-30 ENCOUNTER — Telehealth: Payer: Self-pay | Admitting: *Deleted

## 2012-08-30 MED ORDER — PRAVASTATIN SODIUM 40 MG PO TABS: 80.0000 mg | ORAL_TABLET | Freq: Every day | ORAL | Status: DC

## 2012-08-30 NOTE — Addendum Note (Signed)
Addended by: Glori Luis on: 08/30/2012 03:11 PM   Modules accepted: Orders

## 2012-08-30 NOTE — Telephone Encounter (Signed)
Message copied by Jose Persia on Fri Aug 30, 2012  3:46 PM ------      Message from: Birdie Sons, ERIC G      Created: Fri Aug 30, 2012  3:38 PM      Regarding: DMV form       Can you please let Mr Mcadory know his DMV form is available for him to pick up at the front desk. There are some sections that need to be filled out by him prior to being sent to [facility]. Thanks.             Eric ------

## 2012-08-30 NOTE — Telephone Encounter (Signed)
Called and informed patient that form is ready to pick up at front desk and that he needs to complete some sections prior to giving to [facility].  Gaylene Brooks, RN

## 2012-09-02 ENCOUNTER — Telehealth: Payer: Self-pay | Admitting: *Deleted

## 2012-09-02 NOTE — Telephone Encounter (Signed)
Pt informed and agreeable. Juan Kelly  

## 2012-09-02 NOTE — Telephone Encounter (Signed)
Pt is aware that form is ready for pick up.

## 2012-09-02 NOTE — Telephone Encounter (Signed)
Message copied by Osborne Oman on Mon Sep 02, 2012  8:51 AM ------      Message from: Birdie Sons, ERIC G      Created: Fri Aug 30, 2012  3:12 PM       Please inform the patient that his cholesterol is better than at last check, though still not at goal. I want him to increase his pravastatin to 80 mg a day. Thanks. ------

## 2012-10-09 ENCOUNTER — Emergency Department (HOSPITAL_BASED_OUTPATIENT_CLINIC_OR_DEPARTMENT_OTHER)
Admission: EM | Admit: 2012-10-09 | Discharge: 2012-10-10 | Disposition: A | Discharge status: Home / Self Care | Payer: BC Managed Care – PPO | Attending: Emergency Medicine | Admitting: Emergency Medicine

## 2012-10-09 ENCOUNTER — Encounter (HOSPITAL_BASED_OUTPATIENT_CLINIC_OR_DEPARTMENT_OTHER): Payer: Self-pay | Admitting: Emergency Medicine

## 2012-10-09 DIAGNOSIS — S61219A Laceration without foreign body of unspecified finger without damage to nail, initial encounter: Secondary | ICD-10-CM

## 2012-10-09 DIAGNOSIS — I10 Essential (primary) hypertension: Secondary | ICD-10-CM | POA: Insufficient documentation

## 2012-10-09 DIAGNOSIS — Z79899 Other long term (current) drug therapy: Secondary | ICD-10-CM | POA: Insufficient documentation

## 2012-10-09 DIAGNOSIS — E785 Hyperlipidemia, unspecified: Secondary | ICD-10-CM | POA: Insufficient documentation

## 2012-10-09 DIAGNOSIS — Z7982 Long term (current) use of aspirin: Secondary | ICD-10-CM | POA: Insufficient documentation

## 2012-10-09 DIAGNOSIS — Y929 Unspecified place or not applicable: Secondary | ICD-10-CM | POA: Insufficient documentation

## 2012-10-09 DIAGNOSIS — Y939 Activity, unspecified: Secondary | ICD-10-CM | POA: Insufficient documentation

## 2012-10-09 DIAGNOSIS — W292XXA Contact with other powered household machinery, initial encounter: Secondary | ICD-10-CM | POA: Insufficient documentation

## 2012-10-09 DIAGNOSIS — E119 Type 2 diabetes mellitus without complications: Secondary | ICD-10-CM | POA: Insufficient documentation

## 2012-10-09 DIAGNOSIS — I252 Old myocardial infarction: Secondary | ICD-10-CM | POA: Insufficient documentation

## 2012-10-09 DIAGNOSIS — S61209A Unspecified open wound of unspecified finger without damage to nail, initial encounter: Secondary | ICD-10-CM | POA: Insufficient documentation

## 2012-10-09 NOTE — ED Notes (Signed)
Pt states he has wound to left index finger x 2-3 weeks. Pt is diabetic and wound is not healing.

## 2012-10-10 MED ORDER — CLINDAMYCIN HCL 150 MG PO CAPS: 150.0000 mg | ORAL_CAPSULE | Freq: Four times a day (QID) | ORAL | Status: DC

## 2012-10-10 NOTE — ED Provider Notes (Signed)
CSN: [id number]     Arrival date & time 10/09/12  2323 History     First MD Initiated Contact with Patient 10/10/12 0001     Chief Complaint  Patient presents with  . Wound Check   (Consider location/radiation/quality/duration/timing/severity/associated sxs/prior Treatment) HPI Pt presents with c/o wound to his left index finger for the past 2-3 weeks.  He is diabetic and concerned this is getting infected.  Pt states he can't remember exactly when he did this but he cut his finger with a knife.  He has kept it covered with a bandaid.  No prurulent drainage, no redness, no pain, no fever/chills. There are no other associated systemic symptoms, there are no other alleviating or modifying factors.  Past Medical History  Diagnosis Date  . Hypertension   . Hyperlipidemia   . Diabetes mellitus   . Myocardial infarction     Did have elevated cardiac enzymes but normal cath   History reviewed. No pertinent past surgical history. Family History  Problem Relation Age of Onset  . Diabetes Mother    History  Substance Use Topics  . Smoking status: Never Smoker   . Smokeless tobacco: Never Used  . Alcohol Use: No    Review of Systems ROS reviewed and all otherwise negative except for mentioned in HPI  Allergies  Review of patient's allergies indicates no known allergies.  Home Medications   Current Outpatient Rx  Name  Route  Sig  Dispense  Refill  . amLODipine (NORVASC) 10 MG tablet   Oral   Take 1 tablet (10 mg total) by mouth daily.   90 tablet   0   . aspirin 81 MG tablet   Oral   Take 81 mg by mouth daily.           . hydrochlorothiazide (HYDRODIURIL) 25 MG tablet   Oral   Take 1 tablet (25 mg total) by mouth daily.   90 tablet   0   . lisinopril (PRINIVIL,ZESTRIL) 40 MG tablet   Oral   Take 1 tablet (40 mg total) by mouth daily.   90 tablet   0   . metFORMIN (GLUCOPHAGE) 1000 MG tablet   Oral   Take 1 tablet (1,000 mg total) by mouth 2 (two) times daily  with a meal.   180 tablet   0   . metoprolol (LOPRESSOR) 100 MG tablet   Oral   Take 1 tablet (100 mg total) by mouth 2 (two) times daily.   180 tablet   0   . Multiple Vitamin (MULTIVITAMIN) capsule   Oral   Take 1 capsule by mouth daily.           . niacin 500 MG CR capsule   Oral   Take 1 capsule (500 mg total) by mouth at bedtime.   90 capsule   0   . Omega-3 Fatty Acids (FISH OIL PO)   Oral   Take by mouth. 3 daily.          . pravastatin (PRAVACHOL) 40 MG tablet   Oral   Take 2 tablets (80 mg total) by mouth at bedtime.   90 tablet   0   . clindamycin (CLEOCIN) 150 MG capsule   Oral   Take 1 capsule (150 mg total) by mouth every 6 (six) hours.   28 capsule   0    BP 150/87  Pulse 60  Temp(Src) 98.6 F (37 C) (Oral)  Resp 18  SpO2 100% Vitals  reviewed Physical Exam Physical Examination: General appearance - alert, well appearing, and in no distress Mental status - alert, oriented to person, place, and time Eyes - no conjunctival injection, no scleral icterus Chest - clear to auscultation, no wheezes, rales or rhonchi, symmetric air entry Heart - normal rate, regular rhythm, normal S1, S2, no murmurs, rubs, clicks or gallops Extremities - peripheral pulses normal, no pedal edema, no clubbing or cyanosis Skin - normal coloration and turgor, no rashes, < 1cm remote partially healing laceration on left finger- skin flap which is not adherent to the wound below- no signs of erythema or prurulent drainage, no fever  ED Course   Procedures (including critical care time)  Labs Reviewed  GLUCOSE, CAPILLARY - Abnormal; Notable for the following:    Glucose-Capillary 207 (*)    All other components within normal limits   No results found. 1. Laceration of finger, initial encounter     MDM  Pt presenting with wound on left thumb- it was lacerated approx 2 weeks ago with a knife and has not healed.  No current signs of infection, pt is diabetic, wound  dressed, will start prophylactic abx and give information for hand surgeryl   Ethelda Chick, MD 10/11/12 [phone]

## 2012-10-23 ENCOUNTER — Telehealth: Payer: Self-pay | Admitting: Family Medicine

## 2012-10-23 NOTE — Telephone Encounter (Signed)
Will forward to MD. Ezella Kell,CMA  

## 2012-10-23 NOTE — Telephone Encounter (Signed)
Left message for pt.  Tinna Kolker,CMA

## 2012-10-23 NOTE — Telephone Encounter (Signed)
Letter placed up front for pick up

## 2012-10-23 NOTE — Telephone Encounter (Signed)
Pt called because he is needing a statement on  letterhead paper from  Dr. Birdie Sons stating that Juan Kelly is not under a doctors care for sleep apnea. He needs this so that his license is not suspended. He will pick this up from [place] when it is ready. JW

## 2012-12-07 ENCOUNTER — Other Ambulatory Visit: Payer: Self-pay | Admitting: Family Medicine

## 2013-03-07 ENCOUNTER — Other Ambulatory Visit: Payer: Self-pay | Admitting: Family Medicine

## 2013-03-07 ENCOUNTER — Telehealth: Payer: Self-pay | Admitting: Family Medicine

## 2013-03-07 MED ORDER — HYDROCHLOROTHIAZIDE 25 MG PO TABS: ORAL_TABLET | ORAL | Status: DC

## 2013-03-07 MED ORDER — METOPROLOL TARTRATE 100 MG PO TABS: ORAL_TABLET | ORAL | Status: DC

## 2013-03-07 MED ORDER — AMLODIPINE BESYLATE 10 MG PO TABS: ORAL_TABLET | ORAL | Status: DC

## 2013-03-07 MED ORDER — METFORMIN HCL 1000 MG PO TABS: ORAL_TABLET | ORAL | Status: DC

## 2013-03-07 NOTE — Telephone Encounter (Signed)
Refills sent in on requested medications for one month. Patient has not been seen in 6 months. He will need to come in for follow-up prior to additional refills being given. Please inform the patient of this. Thanks.

## 2013-03-10 ENCOUNTER — Encounter: Payer: Self-pay | Admitting: *Deleted

## 2013-03-10 NOTE — Telephone Encounter (Signed)
Mailed letter. Lorae Roig Dawn  

## 2013-03-11 NOTE — Telephone Encounter (Signed)
Recent phone note by Dr. Birdie SonsSonnenberg indicates some meds refilled for 1 month only as pt needs to follow up for further refills. Will similarly refill lisinopril for 30 days only; pt will need f/u with PCP Dr. Birdie SonsSonnenberg for further refills. --CMS

## 2013-04-22 ENCOUNTER — Other Ambulatory Visit: Payer: Self-pay | Admitting: Family Medicine

## 2013-04-22 NOTE — Telephone Encounter (Signed)
Needs refill on blood pressure meds, cholostrol, and metformin He is out of the meds

## 2013-04-22 NOTE — Telephone Encounter (Signed)
Will forward to patient's PCP.  Yandiel Bergum, Darlyne RussianKristen L, CMA

## 2013-04-24 ENCOUNTER — Other Ambulatory Visit: Payer: Self-pay | Admitting: *Deleted

## 2013-04-24 MED ORDER — AMLODIPINE BESYLATE 10 MG PO TABS: ORAL_TABLET | ORAL | Status: DC

## 2013-04-24 MED ORDER — METFORMIN HCL 1000 MG PO TABS: ORAL_TABLET | ORAL | Status: DC

## 2013-04-24 MED ORDER — HYDROCHLOROTHIAZIDE 25 MG PO TABS: ORAL_TABLET | ORAL | Status: DC

## 2013-04-24 MED ORDER — METOPROLOL TARTRATE 100 MG PO TABS: ORAL_TABLET | ORAL | Status: DC

## 2013-04-24 NOTE — Telephone Encounter (Signed)
Pt is aware.  States that he will call back and schedule this appt.  Jazmin Hartsell,CMA

## 2013-04-24 NOTE — Telephone Encounter (Signed)
Patient should be advised to come in for a follow-up visit in the next month. 

## 2013-04-25 MED ORDER — AMLODIPINE BESYLATE 10 MG PO TABS: ORAL_TABLET | ORAL | Status: DC

## 2013-04-25 MED ORDER — LISINOPRIL 40 MG PO TABS: ORAL_TABLET | ORAL | Status: DC

## 2013-04-25 MED ORDER — METOPROLOL TARTRATE 100 MG PO TABS: ORAL_TABLET | ORAL | Status: DC

## 2013-04-25 MED ORDER — HYDROCHLOROTHIAZIDE 25 MG PO TABS: ORAL_TABLET | ORAL | Status: DC

## 2013-04-25 MED ORDER — METFORMIN HCL 1000 MG PO TABS: ORAL_TABLET | ORAL | Status: DC

## 2013-05-30 ENCOUNTER — Ambulatory Visit: Payer: BC Managed Care – PPO | Admitting: Family Medicine

## 2013-06-04 ENCOUNTER — Ambulatory Visit: Payer: BC Managed Care – PPO | Admitting: Family Medicine

## 2013-06-06 ENCOUNTER — Encounter: Payer: Self-pay | Admitting: Family Medicine

## 2013-06-06 ENCOUNTER — Ambulatory Visit (INDEPENDENT_AMBULATORY_CARE_PROVIDER_SITE_OTHER): Payer: 59 | Admitting: Family Medicine

## 2013-06-06 VITALS — BP 172/106 | HR 75 | Temp 97.4°F | Ht 69.0 in | Wt 254.1 lb

## 2013-06-06 DIAGNOSIS — E119 Type 2 diabetes mellitus without complications: Secondary | ICD-10-CM

## 2013-06-06 DIAGNOSIS — E785 Hyperlipidemia, unspecified: Secondary | ICD-10-CM

## 2013-06-06 DIAGNOSIS — I10 Essential (primary) hypertension: Secondary | ICD-10-CM

## 2013-06-06 LAB — POCT GLYCOSYLATED HEMOGLOBIN (HGB A1C): HEMOGLOBIN A1C: 7.5

## 2013-06-06 MED ORDER — PRAVASTATIN SODIUM 40 MG PO TABS: 80.0000 mg | ORAL_TABLET | Freq: Every day | ORAL | Status: DC

## 2013-06-06 MED ORDER — LISINOPRIL 40 MG PO TABS: ORAL_TABLET | ORAL | Status: DC

## 2013-06-06 MED ORDER — AMLODIPINE BESYLATE 10 MG PO TABS: ORAL_TABLET | ORAL | Status: DC

## 2013-06-06 MED ORDER — HYDROCHLOROTHIAZIDE 25 MG PO TABS: ORAL_TABLET | ORAL | Status: DC

## 2013-06-06 MED ORDER — METFORMIN HCL 1000 MG PO TABS: ORAL_TABLET | ORAL | Status: DC

## 2013-06-06 MED ORDER — METOPROLOL TARTRATE 100 MG PO TABS: ORAL_TABLET | ORAL | Status: DC

## 2013-06-06 NOTE — Patient Instructions (Signed)
After you have refilled your prescriptions, I would like you to come back in 1-2 weeks and check your blood pressure while you are on the medications.  If your hemoglobin A1c is very elevated someone from clinic will call you and ask you to come back in if we would like to change any of your medications.  Otherwise please follow up in 3 months for diabetes and hypertension follow up (we will likely check your blood work at that visit)

## 2013-06-06 NOTE — Assessment & Plan Note (Signed)
A1c is 7.5 today (improved from 8.9 last July). Unable to get retinal scan done in office today, pt will have this done at Dr. Ashley RoyaltyShapiro's office. P: continue metformin. F/u in 3 months.

## 2013-06-06 NOTE — Progress Notes (Signed)
Patient ID: Haskell Rilingony Grosshans, male   DOB: 1955-04-02, 58 y.o.   MRN: [id number]   Subjective:    Patient ID: Haskell Rilingony Rastetter, male    DOB: 1955-04-02, 58 y.o.   MRN: [id number]  HPI  CC: follow up  # Diabetes:  Takes metformin, does not check sugars  Last a1c 09/02/12 = 8.9 ROS: No complaints of dizziness/lightheadedness, no changes in vision, no polyuria, no pain or numbness of feet  # Hypertension  Ran out of medications 4 days ago ROS: no CP, SOB, headache, changes in vision  Review of Systems   See HPI for ROS. Objective:  BP 172/106  Pulse 75  Temp(Src) 97.4 F (36.3 C) (Oral)  Ht 5\' 9"  (1.753 m)  Wt 254 lb 1.6 oz (115.259 kg)  BMI 37.51 kg/m2  General: NAD Cardiac: RRR, normal heart sounds, no murmurs. 2+ radial and PT pulses bilaterally Respiratory: CTAB, normal effort Extremities: no edema or cyanosis. WWP.     Assessment & Plan:  See Problem List Documentation

## 2013-06-06 NOTE — Assessment & Plan Note (Signed)
Elevated today while off meds x 4 days. Refills given. F/u in 2 weeks for nursing visit for BP check.

## 2013-08-13 ENCOUNTER — Telehealth: Payer: Self-pay | Admitting: Family Medicine

## 2013-08-13 NOTE — Telephone Encounter (Signed)
Called Pt about DM care. When Pt calls back, please schedule appointment to check BP, LDL and A1C.  ° °

## 2013-08-22 ENCOUNTER — Other Ambulatory Visit: Payer: Self-pay | Admitting: Family Medicine

## 2013-08-25 NOTE — Telephone Encounter (Signed)
Patient should be advised to come in for a follow-up visit in the next month.

## 2013-08-25 NOTE — Telephone Encounter (Signed)
Pt informed. Burrel Legrand Dawn  

## 2013-09-19 ENCOUNTER — Other Ambulatory Visit: Payer: Self-pay | Admitting: Family Medicine

## 2013-09-20 ENCOUNTER — Telehealth: Payer: Self-pay | Admitting: Family Medicine

## 2013-09-20 DIAGNOSIS — E785 Hyperlipidemia, unspecified: Secondary | ICD-10-CM

## 2013-09-20 MED ORDER — LISINOPRIL 40 MG PO TABS: ORAL_TABLET | ORAL | Status: DC

## 2013-09-20 MED ORDER — AMLODIPINE BESYLATE 10 MG PO TABS: ORAL_TABLET | ORAL | Status: DC

## 2013-09-20 MED ORDER — PRAVASTATIN SODIUM 40 MG PO TABS: 80.0000 mg | ORAL_TABLET | Freq: Every day | ORAL | Status: DC

## 2013-09-20 NOTE — Telephone Encounter (Signed)
Pt called after hours telephone for med refills. Informed that we do not routinely give refills over the phone without being seen.  Last seen in April; will refill for 2 weeks worth so pt does not go into hypertensive crisis [facility]

## 2013-09-21 NOTE — Telephone Encounter (Signed)
Patient needs to come in for an appointment. Has not been seen since April and his blood pressure was very uncontrolled at that time. Will give one month of refills. Please inform patient and have him schedule an appointment. Thanks.

## 2013-09-22 NOTE — Telephone Encounter (Signed)
LMOVM for pt to return call.  Please schedule appt as requested by MD. Milas GainFleeger, Maryjo RochesterJessica Dawn

## 2013-10-08 ENCOUNTER — Telehealth: Payer: Self-pay | Admitting: Family Medicine

## 2013-10-08 ENCOUNTER — Ambulatory Visit: Payer: 59 | Admitting: Family Medicine

## 2013-10-08 DIAGNOSIS — E785 Hyperlipidemia, unspecified: Secondary | ICD-10-CM

## 2013-10-08 MED ORDER — PRAVASTATIN SODIUM 40 MG PO TABS: 80.0000 mg | ORAL_TABLET | Freq: Every day | ORAL | Status: DC

## 2013-10-08 MED ORDER — METOPROLOL TARTRATE 100 MG PO TABS: ORAL_TABLET | ORAL | Status: DC

## 2013-10-08 MED ORDER — METFORMIN HCL 1000 MG PO TABS: ORAL_TABLET | ORAL | Status: DC

## 2013-10-08 MED ORDER — ASPIRIN 81 MG PO TABS: 81.0000 mg | ORAL_TABLET | Freq: Every day | ORAL | Status: DC

## 2013-10-08 MED ORDER — NIACIN ER 500 MG PO CPCR: 500.0000 mg | ORAL_CAPSULE | Freq: Every day | ORAL | Status: DC

## 2013-10-08 MED ORDER — AMLODIPINE BESYLATE 10 MG PO TABS: ORAL_TABLET | ORAL | Status: DC

## 2013-10-08 MED ORDER — HYDROCHLOROTHIAZIDE 25 MG PO TABS: ORAL_TABLET | ORAL | Status: DC

## 2013-10-08 MED ORDER — LISINOPRIL 40 MG PO TABS: ORAL_TABLET | ORAL | Status: DC

## 2013-10-08 NOTE — Telephone Encounter (Signed)
Refills given for one month. 

## 2013-10-08 NOTE — Telephone Encounter (Signed)
Will forward to MD. Leoma Folds,CMA  

## 2013-10-08 NOTE — Telephone Encounter (Signed)
Pt was coming in for a OV today 8/19 but had to reschedule due to Dr. Birdie SonsSonnenberg being paged to delivery a baby. Can we call in refills for his medications. jw

## 2013-10-30 ENCOUNTER — Other Ambulatory Visit: Payer: Self-pay | Admitting: Family Medicine

## 2013-10-31 NOTE — Telephone Encounter (Signed)
Refills given for one month. Patient should be advised that he needs to follow-up in clinic in the next month as he was supposed to follow-up 2 weeks after his appointment in April, though has not been in since that time. Thanks.

## 2013-10-31 NOTE — Telephone Encounter (Signed)
Has appt 11/04/13. Juan Kelly, Juan Kelly

## 2013-11-04 ENCOUNTER — Ambulatory Visit (INDEPENDENT_AMBULATORY_CARE_PROVIDER_SITE_OTHER): Payer: 59 | Admitting: Family Medicine

## 2013-11-04 ENCOUNTER — Encounter: Payer: Self-pay | Admitting: Family Medicine

## 2013-11-04 VITALS — BP 145/88 | HR 79 | Temp 98.6°F | Wt 252.0 lb

## 2013-11-04 DIAGNOSIS — Z23 Encounter for immunization: Secondary | ICD-10-CM

## 2013-11-04 DIAGNOSIS — R011 Cardiac murmur, unspecified: Secondary | ICD-10-CM

## 2013-11-04 DIAGNOSIS — M25562 Pain in left knee: Secondary | ICD-10-CM

## 2013-11-04 DIAGNOSIS — E119 Type 2 diabetes mellitus without complications: Secondary | ICD-10-CM

## 2013-11-04 DIAGNOSIS — M25569 Pain in unspecified knee: Secondary | ICD-10-CM

## 2013-11-04 DIAGNOSIS — I10 Essential (primary) hypertension: Secondary | ICD-10-CM

## 2013-11-04 LAB — BASIC METABOLIC PANEL
BUN: 14 mg/dL (ref 6–23)
CALCIUM: 9.9 mg/dL (ref 8.4–10.5)
CO2: 27 mEq/L (ref 19–32)
Chloride: 103 mEq/L (ref 96–112)
Creat: 1.03 mg/dL (ref 0.50–1.35)
GLUCOSE: 137 mg/dL — AB (ref 70–99)
Potassium: 4 mEq/L (ref 3.5–5.3)
Sodium: 138 mEq/L (ref 135–145)

## 2013-11-04 LAB — POCT GLYCOSYLATED HEMOGLOBIN (HGB A1C): HEMOGLOBIN A1C: 7.3

## 2013-11-04 MED ORDER — PRAVASTATIN SODIUM 40 MG PO TABS: ORAL_TABLET | ORAL | Status: DC

## 2013-11-04 MED ORDER — METFORMIN HCL 1000 MG PO TABS: ORAL_TABLET | ORAL | Status: DC

## 2013-11-04 MED ORDER — AMLODIPINE BESYLATE 10 MG PO TABS: ORAL_TABLET | ORAL | Status: DC

## 2013-11-04 MED ORDER — HYDROCHLOROTHIAZIDE 25 MG PO TABS: ORAL_TABLET | ORAL | Status: DC

## 2013-11-04 MED ORDER — NIACIN ER 500 MG PO CPCR: 500.0000 mg | ORAL_CAPSULE | Freq: Every day | ORAL | Status: DC

## 2013-11-04 MED ORDER — ASPIRIN 81 MG PO TABS: 81.0000 mg | ORAL_TABLET | Freq: Every day | ORAL | Status: DC

## 2013-11-04 MED ORDER — METOPROLOL TARTRATE 100 MG PO TABS: ORAL_TABLET | ORAL | Status: DC

## 2013-11-04 MED ORDER — LISINOPRIL 40 MG PO TABS: ORAL_TABLET | ORAL | Status: DC

## 2013-11-04 NOTE — Patient Instructions (Signed)
Nice to see you. We are going to get an echo to look for a cause of your heart murmur. We will call you with the results of your lab work.

## 2013-11-05 ENCOUNTER — Encounter: Payer: Self-pay | Admitting: Family Medicine

## 2013-11-05 DIAGNOSIS — M25562 Pain in left knee: Secondary | ICD-10-CM | POA: Insufficient documentation

## 2013-11-05 DIAGNOSIS — R011 Cardiac murmur, unspecified: Secondary | ICD-10-CM | POA: Insufficient documentation

## 2013-11-05 NOTE — Progress Notes (Signed)
Patient ID: Juan Kelly, male   DOB: May 21, 1955, 58 y.o.   MRN: [id number]  Marikay Alar, MD Phone: [phone]  Juan Kelly is a 58 y.o. male who presents today for f/u.  HYPERTENSION Disease Monitoring Home BP Monitoring states is 120/80 at home Chest pain- no    Dyspnea- no  Orthopnea- no   PND- no Medications Compliance-  Taking daily, though has only taken one of his medications today.  Edema- no  DIABETES Disease Monitoring: Blood Sugar ranges-not checking Polyuria/phagia/dipsia- no      Visual problems- no Medications: Compliance- taking metformin, though sometimes forgets to take the second dose Hypoglycemic symptoms- no  Left knee pain: started 4 months ago after he worked a job that required a lot of bending. Started to hurt after the job. No acute injury noted. No popping, catching, locking, or giving out. He has not tried any medications for this. He has worn a brace and this has helped with the pain. He does not have issues with it most of the time.   Patient is a nonsmoker.   ROS: Per HPI   Physical Exam Filed Vitals:   11/04/13 1610  BP: 145/88  Pulse: 79  Temp: 98.6 F (37 C)    Gen: Well NAD HEENT: PERRL,  MMM Lungs: CTABL Nl WOB Heart: RRR 2/6 RUSB systolic murmur, no RG MSK: bilateral knees with no swelling or erythema, no joint line tenderness, no ligamentous laxity, negative mcmurray, normal sensation and strength Exts: Non edematous BL  LE, warm and well perfused.    Assessment/Plan: Please see individual problem list.  # Healthcare maintenance: flu shot given, will return for physical and lipid panel.  Marikay Alar, MD [facility] PGY-3

## 2013-11-05 NOTE — Assessment & Plan Note (Signed)
Newly appreciated murmur. No symptoms at this time. Prior echo in 2010 with mildly thickened aortic valve. Will obtain echo to further evaluate this murmur.

## 2013-11-05 NOTE — Progress Notes (Signed)
Spoke with patient and informed him that the pcp on his card is not in our office.  He will need to call and get this changed with his insurance company to one of our attendings.  Pt states that he will work on this and let us know when it has been fixed.  Will wait til Friday to see if this has been done and it not will call echo lab to cancel.  [id number].  Shelli Portilla,CMA

## 2013-11-05 NOTE — Assessment & Plan Note (Addendum)
Above goal today, though reports not taking all of his medications yet. We will continue his current regimen. He will follow-up at his convenience in the next month for a CPE and we will recheck his BP at that time. BMET today. Refills given.

## 2013-11-05 NOTE — Assessment & Plan Note (Addendum)
A1c is stable. Will continue metformin. Patient talked at length about diet and exercise and his desire to make changes in his life regarding these things. I encouraged this and will continue to follow-up on this. Refills given.

## 2013-11-05 NOTE — Assessment & Plan Note (Signed)
No abnormalities on exam today and no pain at this time. Potentially could be arthritic in nature. Patient would prefer to stick to wearing the brace at this time. He is to return to care if this worsens or does not get better. Consider imaging to evaluate for arthritic changes if not getting better.

## 2013-11-08 ENCOUNTER — Encounter: Payer: Self-pay | Admitting: Family Medicine

## 2013-11-11 ENCOUNTER — Other Ambulatory Visit (HOSPITAL_COMMUNITY): Payer: 59

## 2014-02-25 ENCOUNTER — Other Ambulatory Visit: Payer: Self-pay | Admitting: Family Medicine

## 2014-03-26 ENCOUNTER — Other Ambulatory Visit: Payer: Self-pay | Admitting: Family Medicine

## 2014-03-27 NOTE — Telephone Encounter (Signed)
Please inform the patient that he should come in to follow-up on his blood pressure. Refills of HCTZ given.

## 2014-03-27 NOTE — Telephone Encounter (Signed)
Tried to call patient but no VM option was given.  Will send a letter to inform him that his medication has been refilled and he will need an appointment. Matthews Franks,CMA

## 2014-05-25 ENCOUNTER — Other Ambulatory Visit: Payer: Self-pay | Admitting: Family Medicine

## 2014-05-26 NOTE — Telephone Encounter (Signed)
Mailed letter to pt today for FU appt. Tirsa Gail, CMA.

## 2014-05-26 NOTE — Telephone Encounter (Signed)
Refill sent in for one month. Patient has not been seen in clinic since 9/15. Will need follow-up in the next month. Please inform the patient. Thanks.

## 2014-10-23 ENCOUNTER — Other Ambulatory Visit: Payer: Self-pay | Admitting: Family Medicine

## 2014-10-23 ENCOUNTER — Other Ambulatory Visit: Payer: Self-pay | Admitting: *Deleted

## 2014-10-23 MED ORDER — METOPROLOL TARTRATE 100 MG PO TABS: ORAL_TABLET | ORAL | Status: DC

## 2014-10-23 NOTE — Telephone Encounter (Signed)
Pt has appt scheudle. Fleeger, Maryjo Rochester

## 2014-11-02 ENCOUNTER — Telehealth: Payer: Self-pay | Admitting: Family Medicine

## 2014-11-02 NOTE — Telephone Encounter (Signed)
Pt here dropping off form to be completed for the DMV. Please contact pt when ready for pickup. Juan Kelly, ASA

## 2014-11-03 NOTE — Telephone Encounter (Signed)
Paperwork placed in PCP box for completion. Will forward to PCP. Bell Cai, CMA.

## 2014-11-04 NOTE — Telephone Encounter (Signed)
Tried to call patient but was unable to leave a message.  He is scheduled for 11-09-14 with Dr. Jimmey Ralph. Jonathon Castelo,CMA

## 2014-11-04 NOTE — Telephone Encounter (Signed)
Patient needs to schedule an appointment to complete the form. He has not been seen in the clinic in over a year.  Katina Degree. Jimmey Ralph, MD [facility] Resident PGY-2 11/04/2014 9:19 AM

## 2014-11-07 ENCOUNTER — Other Ambulatory Visit: Payer: Self-pay | Admitting: Family Medicine

## 2014-11-09 ENCOUNTER — Encounter: Payer: Self-pay | Admitting: Family Medicine

## 2014-11-09 ENCOUNTER — Ambulatory Visit (INDEPENDENT_AMBULATORY_CARE_PROVIDER_SITE_OTHER): Payer: Self-pay | Admitting: Family Medicine

## 2014-11-09 VITALS — BP 131/80 | HR 67 | Temp 98.6°F | Ht 68.5 in | Wt 248.4 lb

## 2014-11-09 DIAGNOSIS — Z129 Encounter for screening for malignant neoplasm, site unspecified: Secondary | ICD-10-CM

## 2014-11-09 DIAGNOSIS — E785 Hyperlipidemia, unspecified: Secondary | ICD-10-CM

## 2014-11-09 DIAGNOSIS — I10 Essential (primary) hypertension: Secondary | ICD-10-CM

## 2014-11-09 DIAGNOSIS — Z0000 Encounter for general adult medical examination without abnormal findings: Secondary | ICD-10-CM

## 2014-11-09 DIAGNOSIS — E118 Type 2 diabetes mellitus with unspecified complications: Secondary | ICD-10-CM

## 2014-11-09 LAB — POCT GLYCOSYLATED HEMOGLOBIN (HGB A1C): Hemoglobin A1C: 7.5

## 2014-11-09 MED ORDER — LISINOPRIL 40 MG PO TABS: ORAL_TABLET | ORAL | Status: DC

## 2014-11-09 MED ORDER — METOPROLOL TARTRATE 100 MG PO TABS: ORAL_TABLET | ORAL | Status: DC

## 2014-11-09 MED ORDER — METFORMIN HCL 1000 MG PO TABS: ORAL_TABLET | ORAL | Status: DC

## 2014-11-09 MED ORDER — ASPIRIN 81 MG PO TABS: 81.0000 mg | ORAL_TABLET | Freq: Every day | ORAL | Status: AC

## 2014-11-09 MED ORDER — AMLODIPINE BESYLATE 10 MG PO TABS: ORAL_TABLET | ORAL | Status: DC

## 2014-11-09 MED ORDER — HYDROCHLOROTHIAZIDE 25 MG PO TABS: 25.0000 mg | ORAL_TABLET | Freq: Every day | ORAL | Status: DC

## 2014-11-09 MED ORDER — NIACIN ER 500 MG PO CPCR: 500.0000 mg | ORAL_CAPSULE | Freq: Every day | ORAL | Status: DC

## 2014-11-09 NOTE — Assessment & Plan Note (Signed)
UTD on colonoscopy. Patient desires prostate cancer screening. Discussed risks and benefits. Patient will have PSA drawn when he returns for labs.

## 2014-11-09 NOTE — Patient Instructions (Signed)
Thank you for coming to the clinic today. It was nice seeing you.  Your blood pressure looks good today.  We will complete your DOT forms.  Please call to schedule an appointment for your blood work when you get insurance.  Please return in 6-12 months, or sooner if you need anything else.  Take Care,  Dr Jimmey Ralph

## 2014-11-09 NOTE — Progress Notes (Signed)
    Subjective:  Juan Kelly is a 59 y.o. male who presents to the [facility] today with a chief complaint of HTN follow up.   HPI:  Hypertension BP Readings from Last 3 Encounters:  11/09/14 131/80  11/04/13 145/88  06/06/13 172/106   Home BP monitoring-Yes, usually in 120s/80s Compliant with medications-yes without side effects ROS-Denies any CP, HA, SOB, blurry vision, LE edema, transient weakness, orthopnea, PND.    Diabetes Currently on metformin  twice daily. Has been tolerating without side effects. 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 On aspirin and statin. Checks feet regularly. Eye exam last year.  ROS- Denies Polyuria,Polydipsia, nocturia, Vision changes, feet or hand numbness/pain/tingling. Denies  Hypoglycemia symptoms (shaky, sweaty, hungry, weak anxious, tremor, palpitations, confusion, behavior change).   HLD. On pravastatin twice a day. Compliant with medications with no side effects.   Healthcare Maintenance Desires PSA screening. UTD on colonoscopy.  ROS: Per HPI  PMH:  The following were reviewed and entered/updated in epic: Past Medical History  Diagnosis Date  . Hypertension   . Hyperlipidemia   . Diabetes mellitus   . Myocardial infarction     Did have elevated cardiac enzymes but normal cath   Patient Active Problem List   Diagnosis Date Noted  . Healthcare maintenance 11/09/2014  . Heart murmur 11/05/2013  . ABNORMAL EKG 07/13/2009  . Diabetes mellitus 04/19/2006  . Hyperlipidemia 04/19/2006  . OBESITY, NOS 04/19/2006  . HYPERTENSION, BENIGN SYSTEMIC 04/19/2006   No past surgical history on file.   Objective:  Physical Exam: BP 131/80 mmHg  Pulse 67  Temp(Src) 98.6 F (37 C) (Oral)  Ht 5' 8.5" (1.74 m)  Wt 248 lb 6 oz (112.662 kg)  BMI 37.21 kg/m2  Gen: NAD, resting comfortably CV: RRR with no murmurs appreciated Lungs: NWOB, CTAB with no crackles, wheezes, or  rhonchi GI: Obese, Normal bowel sounds present. Soft, Nontender, Nondistended. MSK: no edema, cyanosis, or clubbing noted - Feet: No areas of breakdown, sensation intact, DP/TP palpable bilaterally Skin: warm, dry Neuro: grossly normal, moves all extremities Psych: Normal affect and thought content  Results for orders placed or performed in visit on 11/09/14 (from the past 72 hour(s))  POCT glycosylated hemoglobin (Hb A1C)     Status: None   Collection Time: 11/09/14  9:00 AM  Result Value Ref Range   Hemoglobin A1C 7.5      Assessment/Plan:  HYPERTENSION, BENIGN SYSTEMIC At goal today. Will continue metoprolol, lisinopril, HCTZ, and amlodipine. Will check BMP when patient returns for labs.   Diabetes mellitus A1c slightly above goal today. Discussed lifestyle modifications. Will not start additional agent today. Continue metformin  bid. Foot exam today. Instructed patient to have annual eye exam.  Will follow up in 6-12 months.   Hyperlipidemia Continue pravastatin. Will recheck lipid panel when patient returns for labs.   Healthcare maintenance UTD on colonoscopy. Patient desires prostate cancer screening. Discussed risks and benefits. Patient will have PSA drawn when he returns for labs.     Katina Degree. Jimmey Ralph, MD [facility] Resident PGY-2 11/09/2014 9:37 AM

## 2014-11-09 NOTE — Assessment & Plan Note (Signed)
Continue pravastatin. Will recheck lipid panel when patient returns for labs.

## 2014-11-09 NOTE — Assessment & Plan Note (Signed)
A1c slightly above goal today. Discussed lifestyle modifications. Will not start additional agent today. Continue metformin  bid. Foot exam today. Instructed patient to have annual eye exam.  Will follow up in 6-12 months.

## 2014-11-09 NOTE — Assessment & Plan Note (Signed)
At goal today. Will continue metoprolol, lisinopril, HCTZ, and amlodipine. Will check BMP when patient returns for labs.

## 2015-01-04 ENCOUNTER — Other Ambulatory Visit: Payer: Self-pay | Admitting: Family Medicine

## 2015-02-18 ENCOUNTER — Other Ambulatory Visit: Payer: Self-pay | Admitting: Family Medicine

## 2015-02-24 ENCOUNTER — Telehealth: Payer: Self-pay | Admitting: *Deleted

## 2015-02-24 MED ORDER — PRAVASTATIN SODIUM 40 MG PO TABS: 80.0000 mg | ORAL_TABLET | Freq: Every evening | ORAL | Status: DC

## 2015-02-24 NOTE — Addendum Note (Signed)
Addended by: Henri MedalHARTSELL, Zarielle Cea M on: 02/24/2015 10:25 AM   Modules accepted: Orders

## 2015-02-24 NOTE — Telephone Encounter (Signed)
Fax received from pharmacy requesting a refill on this medication.  It was originally sent to wrong office for refill. Yosgar Demirjian,CMA

## 2015-02-24 NOTE — Telephone Encounter (Signed)
Rx filled.  Juan Degreealeb M. Jimmey RalphParker, MD [facility] Resident PGY-2 02/24/2015 8:05 PM

## 2015-02-24 NOTE — Telephone Encounter (Signed)
Received refill request from pharmacy for pravastatin 40 mg #90--take 2 tablets po QHS.  Pravastatin not listed on med list.  Will check with PCP if med has been d/c'd or to continue taking.  Altamese Dilling~Takeshia Wenk, BSN, RN-BC

## 2015-03-08 ENCOUNTER — Other Ambulatory Visit: Payer: Self-pay | Admitting: Family Medicine

## 2015-03-08 MED ORDER — PRAVASTATIN SODIUM 80 MG PO TABS: 80.0000 mg | ORAL_TABLET | Freq: Every evening | ORAL | Status: DC

## 2015-05-24 ENCOUNTER — Encounter: Payer: Self-pay | Admitting: Internal Medicine

## 2015-08-30 ENCOUNTER — Emergency Department (HOSPITAL_BASED_OUTPATIENT_CLINIC_OR_DEPARTMENT_OTHER): Payer: BLUE CROSS/BLUE SHIELD

## 2015-08-30 ENCOUNTER — Emergency Department (HOSPITAL_BASED_OUTPATIENT_CLINIC_OR_DEPARTMENT_OTHER)
Admission: EM | Admit: 2015-08-30 | Discharge: 2015-08-30 | Disposition: A | Discharge status: Home / Self Care | Payer: BLUE CROSS/BLUE SHIELD | Attending: Emergency Medicine | Admitting: Emergency Medicine

## 2015-08-30 ENCOUNTER — Encounter (HOSPITAL_BASED_OUTPATIENT_CLINIC_OR_DEPARTMENT_OTHER): Payer: Self-pay | Admitting: *Deleted

## 2015-08-30 DIAGNOSIS — M1712 Unilateral primary osteoarthritis, left knee: Secondary | ICD-10-CM

## 2015-08-30 DIAGNOSIS — M25562 Pain in left knee: Secondary | ICD-10-CM | POA: Diagnosis present

## 2015-08-30 DIAGNOSIS — Z79899 Other long term (current) drug therapy: Secondary | ICD-10-CM | POA: Diagnosis not present

## 2015-08-30 DIAGNOSIS — I10 Essential (primary) hypertension: Secondary | ICD-10-CM | POA: Insufficient documentation

## 2015-08-30 DIAGNOSIS — Z7984 Long term (current) use of oral hypoglycemic drugs: Secondary | ICD-10-CM | POA: Insufficient documentation

## 2015-08-30 DIAGNOSIS — M25462 Effusion, left knee: Secondary | ICD-10-CM

## 2015-08-30 DIAGNOSIS — I252 Old myocardial infarction: Secondary | ICD-10-CM | POA: Diagnosis not present

## 2015-08-30 DIAGNOSIS — E785 Hyperlipidemia, unspecified: Secondary | ICD-10-CM | POA: Insufficient documentation

## 2015-08-30 DIAGNOSIS — Z7982 Long term (current) use of aspirin: Secondary | ICD-10-CM | POA: Diagnosis not present

## 2015-08-30 DIAGNOSIS — E119 Type 2 diabetes mellitus without complications: Secondary | ICD-10-CM | POA: Insufficient documentation

## 2015-08-30 MED ORDER — NAPROXEN 500 MG PO TABS: 500.0000 mg | ORAL_TABLET | Freq: Two times a day (BID) | ORAL | Status: DC

## 2015-08-30 MED FILL — NAPROXEN 500 MG TABLET: 500 | 10 days supply | Qty: 20 | Fill #0

## 2015-08-30 NOTE — ED Provider Notes (Signed)
CSN: [id number]     Arrival date & time 08/30/15  [id number] History   First MD Initiated Contact with Patient 08/30/15 1027     Chief Complaint  Patient presents with  . Knee Pain    left     (Consider location/radiation/quality/duration/timing/severity/associated sxs/prior Treatment) HPI Comments: Patient with remote knee injury one year ago presents with complaint of worsening left knee pain and swelling. Patient states that upon waking in the morning, or sitting for a long period of time, the knee is very painful when he walks. Once he becomes active the pain improves. No fevers or redness. No history of gout or other arthritis. No treatments prior to arrival. No other recent injuries. Onset of symptoms insidious.   The history is provided by the patient.    Past Medical History  Diagnosis Date  . Hypertension   . Hyperlipidemia   . Diabetes mellitus   . Myocardial infarction [facility](HCC)     Did have elevated cardiac enzymes but normal cath   History reviewed. No pertinent past surgical history. Family History  Problem Relation Age of Onset  . Diabetes Mother    Social History  Substance Use Topics  . Smoking status: Never Smoker   . Smokeless tobacco: Never Used  . Alcohol Use: No    Review of Systems  Constitutional: Negative for activity change.  Musculoskeletal: Positive for joint swelling, arthralgias and gait problem. Negative for back pain and neck pain.  Skin: Negative for wound.  Neurological: Negative for weakness and numbness.      Allergies  Review of patient's allergies indicates no known allergies.  Home Medications   Prior to Admission medications   Medication Sig Start Date End Date Taking? Authorizing Provider  amLODipine (NORVASC) 10 MG tablet TAKE ONE TABLET BY MOUTH ONCE DAILY 11/09/14   Ardith Darkaleb M Parker, MD  aspirin 81 MG tablet Take 1 tablet (81 mg total) by mouth daily. 11/09/14   Ardith Darkaleb M Parker, MD  hydrochlorothiazide (HYDRODIURIL) 25 MG tablet Take  1 tablet (25 mg total) by mouth daily. 11/09/14   Ardith Darkaleb M Parker, MD  lisinopril (PRINIVIL,ZESTRIL) 40 MG tablet TAKE ONE TABLET BY MOUTH ONCE DAILY 11/09/14   Ardith Darkaleb M Parker, MD  metFORMIN (GLUCOPHAGE) 1000 MG tablet TAKE ONE TABLET BY MOUTH TWICE DAILY WITH MEALS 11/09/14   Ardith Darkaleb M Parker, MD  metoprolol (LOPRESSOR) 100 MG tablet TAKE ONE TABLET BY MOUTH TWICE DAILY 11/09/14   Ardith Darkaleb M Parker, MD  Multiple Vitamin (MULTIVITAMIN) capsule Take 1 capsule by mouth daily.      Historical Provider, MD  niacin 500 MG CR capsule Take 1 capsule (500 mg total) by mouth at bedtime. 11/09/14   Ardith Darkaleb M Parker, MD  Omega-3 Fatty Acids (FISH OIL PO) Take by mouth. 3 daily.     Historical Provider, MD  pravastatin (PRAVACHOL) 80 MG tablet Take 1 tablet (80 mg total) by mouth every evening. 03/08/15 03/07/16  Ardith Darkaleb M Parker, MD   BP 161/94 mmHg  Pulse 67  Temp(Src) 98.7 F (37.1 C) (Oral)  Resp 18  Ht 5\' 9"  (1.753 m)  Wt 108.863 kg  BMI 35.43 kg/m2  SpO2 98%   Physical Exam  Constitutional: He appears well-developed and well-nourished.  HENT:  Head: Normocephalic and atraumatic.  Eyes: Conjunctivae are normal.  Neck: Normal range of motion. Neck supple.  Cardiovascular: Normal pulses.   Musculoskeletal: He exhibits tenderness. He exhibits no edema.       Left hip: Normal.  Left knee: He exhibits effusion (Minor). He exhibits normal range of motion. Tenderness found. Medial joint line and lateral joint line tenderness noted.       Left ankle: Normal.       Left upper leg: Normal.       Left lower leg: Normal.       Left foot: Normal.  Neurological: He is alert. No sensory deficit.  Motor, sensation, and vascular distal to the injury is fully intact.   Skin: Skin is warm and dry.  Psychiatric: He has a normal mood and affect.  Nursing note and vitals reviewed.   ED Course  Procedures (including critical care time)  Imaging Review Dg Knee Complete 4 Views Left  08/30/2015  CLINICAL DATA:   Chronic pain after twisting injury 1 year prior EXAM: LEFT KNEE - COMPLETE 4+ VIEW COMPARISON:  None. FINDINGS: Frontal, lateral, and bilateral oblique views were obtained. There is no acute fracture or dislocation. There is a focal joint effusion. There is marked narrowing medially. There is moderately severe narrowing in the patellofemoral joint. There is spurring in all compartments. No erosive change. There are scattered foci of vascular calcification. IMPRESSION: Extensive osteoarthritic change, most marked medially. There is a joint effusion. No acute fracture or dislocation. There is scattered foci of atherosclerotic vascular calcification. Electronically Signed   By: Bretta Bang III M.D.   On: 08/30/2015 11:11   I have personally reviewed and evaluated these images and lab results as part of my medical decision-making.   10:41 AM Patient seen and examined. X-ray ordered.   Vital signs reviewed and are as follows: BP 161/94 mmHg  Pulse 67  Temp(Src) 98.7 F (37.1 C) (Oral)  Resp 18  Ht  (1.753 m)  Wt 108.863 kg  BMI 35.43 kg/m2  SpO2 98%  11:46 AM patient informed of x-ray results. Will place on naproxen. Sports medicine referral given. Counseled on RICE protocol.   MDM   Final diagnoses:  Primary osteoarthritis of left knee  Knee effusion, left   Knee pain, history suspicious for osteoarthritis. X-ray confirms. Treatment as above. Lower extremity is neurovascularly intact. Do not suspect septic joint.    Renne Crigler, PA-C 08/30/15 1147  Melene Plan, DO 08/30/15 1147

## 2015-08-30 NOTE — Discharge Instructions (Signed)
Please read and follow all provided instructions.  Your diagnoses today include:  1. Primary osteoarthritis of left knee   2. Knee effusion, left     Tests performed today include:  An x-ray of the affected area - does NOT show any broken bones, shows extensive arthritis  Vital signs. See below for your results today.   Medications prescribed:   Naproxen - anti-inflammatory pain medication  Do not exceed 500mg  naproxen every 12 hours, take with food  You have been prescribed an anti-inflammatory medication or NSAID. Take with food. Take smallest effective dose for the shortest duration needed for your pain. Stop taking if you experience stomach pain or vomiting.   Take any prescribed medications only as directed.  Home care instructions:   Follow any educational materials contained in this packet  Follow R.I.C.E. Protocol:  R - rest your injury   I  - use ice on injury without applying directly to skin  C - compress injury with bandage or splint  E - elevate the injury as much as possible  Follow-up instructions: Please follow-up with the provided orthopedic physician (bone specialist) in 1 week.   Return instructions:   Please return if your toes or feet are numb or tingling, appear gray or blue, or you have severe pain (also elevate the leg and loosen splint or wrap if you were given one)  Please return to the Emergency Department if you experience worsening symptoms.   Please return if you have any other emergent concerns.  Additional Information:  Your vital signs today were: BP 161/94 mmHg   Pulse 67   Temp(Src) 98.7 F (37.1 C) (Oral)   Resp 18   Ht 5\' 9"  (1.753 m)   Wt 108.863 kg   BMI 35.43 kg/m2   SpO2 98% If your blood pressure (BP) was elevated above 135/85 this visit, please have this repeated by your doctor within one month. -------------- If prescribed crutches for your injury: use crutches with non-weight bearing for the first few days. Then, you  may walk as the pain allows, or as instructed. Start gradually with weight bearing on the affected side. Once you can walk pain free, then try jogging. When you can run forwards, then you can try moving side-to-side. If you cannot walk without crutches in one week, you need a re-check. --------------

## 2015-08-30 NOTE — ED Notes (Addendum)
Patient states he injured his left knee by a twisting movement approximately one year ago.  States he has had intermittent pain since.  States the pain is getting worse and causing him to limp.  Moderate swelling noted around joint. States pain is worse in the morning when he gets up and improves after walking.

## 2015-08-31 ENCOUNTER — Other Ambulatory Visit: Payer: Self-pay | Admitting: *Deleted

## 2015-09-01 MED ORDER — HYDROCHLOROTHIAZIDE 25 MG PO TABS: 25.0000 mg | ORAL_TABLET | Freq: Every day | ORAL | Status: DC

## 2015-09-01 MED ORDER — METOPROLOL TARTRATE 100 MG PO TABS: ORAL_TABLET | ORAL | Status: DC

## 2015-09-01 MED ORDER — LISINOPRIL 40 MG PO TABS: ORAL_TABLET | ORAL | Status: DC

## 2015-09-01 MED ORDER — AMLODIPINE BESYLATE 10 MG PO TABS: ORAL_TABLET | ORAL | Status: DC

## 2015-09-01 NOTE — Telephone Encounter (Signed)
I called pt no answer and unable to leave VM. Page, cma.

## 2015-09-01 NOTE — Telephone Encounter (Signed)
Rx filled.  Caleb M. Parker, MD [facility] Resident PGY-3 09/01/2015 7:20 AM   

## 2015-09-02 ENCOUNTER — Encounter: Payer: Self-pay | Admitting: Family Medicine

## 2015-09-02 ENCOUNTER — Ambulatory Visit (INDEPENDENT_AMBULATORY_CARE_PROVIDER_SITE_OTHER): Payer: BLUE CROSS/BLUE SHIELD | Admitting: Family Medicine

## 2015-09-02 VITALS — BP 149/87 | HR 73 | Ht 69.0 in | Wt 245.0 lb

## 2015-09-02 DIAGNOSIS — M25562 Pain in left knee: Secondary | ICD-10-CM | POA: Diagnosis not present

## 2015-09-02 NOTE — Patient Instructions (Signed)
Your pain is due to arthritis. These are the different medications you can take for this: Tylenol 500mg  1-2 tabs three times a day for pain. Glucosamine sulfate 750mg  twice a day is a supplement that may help. Capsaicin, aspercreme, or biofreeze topically up to four times a day may also help with pain. Aleve 1-2 tabs twice a day with food Cortisone injections are an option. If cortisone injections do not help, there are different types of shots that may help but they take longer to take effect. It's important that you continue to stay active. Straight leg raises, knee extensions 3 sets of 10 once a day (add ankle weight if these become too easy). Consider physical therapy to strengthen muscles around the joint that hurts to take pressure off of the joint itself. Shoe inserts with good arch support may be helpful. Heat or ice 15 minutes at a time 3-4 times a day as needed to help with pain. Water aerobics and cycling with low resistance are the best two types of exercise for arthritis. I would just avoid deep squats, deep lunges, leg press. Follow up with me in 6 weeks or as needed.

## 2015-09-07 DIAGNOSIS — M25562 Pain in left knee: Secondary | ICD-10-CM | POA: Insufficient documentation

## 2015-09-07 NOTE — Progress Notes (Signed)
PCP: Jacquiline Doealeb Parker, MD  Subjective:   HPI: Patient is a 60 y.o. male here for left knee pain.  Patient reports having pain in left knee for about 1 year. Recalls twisting knee around this time but has done ok since then. Pain is dull, anterior, 2/10 level. Notices after prolonged sitting and going to get up. Worse in the morning. Associated stiffness, slight swelling. No skin changes, numbness. Not taking anything for this.  Past Medical History  Diagnosis Date  . Hypertension   . Hyperlipidemia   . Diabetes mellitus   . Myocardial infarction [facility](HCC)     Did have elevated cardiac enzymes but normal cath    Current Outpatient Prescriptions on File Prior to Visit  Medication Sig Dispense Refill  . amLODipine (NORVASC) 10 MG tablet TAKE ONE TABLET BY MOUTH ONCE DAILY 90 tablet 3  . aspirin 81 MG tablet Take 1 tablet (81 mg total) by mouth daily. 90 tablet 3  . hydrochlorothiazide (HYDRODIURIL) 25 MG tablet Take 1 tablet (25 mg total) by mouth daily. 90 tablet 3  . lisinopril (PRINIVIL,ZESTRIL) 40 MG tablet TAKE ONE TABLET BY MOUTH ONCE DAILY 90 tablet 3  . metFORMIN (GLUCOPHAGE) 1000 MG tablet TAKE ONE TABLET BY MOUTH TWICE DAILY WITH MEALS 180 tablet 3  . metoprolol (LOPRESSOR) 100 MG tablet TAKE ONE TABLET BY MOUTH TWICE DAILY 180 tablet 3  . Multiple Vitamin (MULTIVITAMIN) capsule Take 1 capsule by mouth daily.      . naproxen (NAPROSYN) 500 MG tablet Take 1 tablet (500 mg total) by mouth 2 (two) times daily. 20 tablet 0  . niacin 500 MG CR capsule Take 1 capsule (500 mg total) by mouth at bedtime. 90 capsule 0  . Omega-3 Fatty Acids (FISH OIL PO) Take by mouth. 3 daily.     . pravastatin (PRAVACHOL) 80 MG tablet Take 1 tablet (80 mg total) by mouth every evening. 30 tablet 11   No current facility-administered medications on file prior to visit.    No past surgical history on file.  No Known Allergies  Social History   Social History  . Marital Status: Single    Spouse  Name: N/A  . Number of Children: N/A  . Years of Education: N/A   Occupational History  . Not on file.   Social History Main Topics  . Smoking status: Never Smoker   . Smokeless tobacco: Never Used  . Alcohol Use: No  . Drug Use: No  . Sexual Activity: Yes   Other Topics Concern  . Not on file   Social History Narrative    Family History  Problem Relation Age of Onset  . Diabetes Mother     BP 149/87 mmHg  Pulse 73  Ht 5\' 9"  (1.753 m)  Wt 245 lb (111.131 kg)  BMI 36.16 kg/m2  Review of Systems: See HPI above.    Objective:  Physical Exam:  Gen: NAD, comfortable in exam room  Left knee: No gross deformity, ecchymoses, swelling. No TTP. FROM. Negative ant/post drawers. Negative valgus/varus testing. Negative lachmanns. Negative mcmurrays, apleys, patellar apprehension. NV intact distally.  Right knee: FROM without pain.    Assessment & Plan:  1. Left knee pain - consistent with arthritis - independently reviewed radiographs showing this.  Exam is benign.  Tylenol, nsaids, glucosamine, topical medications reviewed.  Shown home exercises to do daily.  Consider physical therapy, injection if not improving.  F/u in 6 weeks or prn.

## 2015-09-07 NOTE — Assessment & Plan Note (Signed)
consistent with arthritis - independently reviewed radiographs showing this.  Exam is benign.  Tylenol, nsaids, glucosamine, topical medications reviewed.  Shown home exercises to do daily.  Consider physical therapy, injection if not improving.  F/u in 6 weeks or prn.

## 2015-10-20 ENCOUNTER — Emergency Department (HOSPITAL_BASED_OUTPATIENT_CLINIC_OR_DEPARTMENT_OTHER)
Admission: EM | Admit: 2015-10-20 | Discharge: 2015-10-20 | Disposition: A | Discharge status: Home / Self Care | Payer: BLUE CROSS/BLUE SHIELD | Attending: Emergency Medicine | Admitting: Emergency Medicine

## 2015-10-20 ENCOUNTER — Encounter (HOSPITAL_BASED_OUTPATIENT_CLINIC_OR_DEPARTMENT_OTHER): Payer: Self-pay | Admitting: Emergency Medicine

## 2015-10-20 DIAGNOSIS — I10 Essential (primary) hypertension: Secondary | ICD-10-CM | POA: Insufficient documentation

## 2015-10-20 DIAGNOSIS — H578 Other specified disorders of eye and adnexa: Secondary | ICD-10-CM | POA: Insufficient documentation

## 2015-10-20 DIAGNOSIS — Z7982 Long term (current) use of aspirin: Secondary | ICD-10-CM | POA: Insufficient documentation

## 2015-10-20 DIAGNOSIS — J029 Acute pharyngitis, unspecified: Secondary | ICD-10-CM | POA: Insufficient documentation

## 2015-10-20 DIAGNOSIS — Z7984 Long term (current) use of oral hypoglycemic drugs: Secondary | ICD-10-CM | POA: Insufficient documentation

## 2015-10-20 DIAGNOSIS — H5789 Other specified disorders of eye and adnexa: Secondary | ICD-10-CM

## 2015-10-20 DIAGNOSIS — E119 Type 2 diabetes mellitus without complications: Secondary | ICD-10-CM | POA: Insufficient documentation

## 2015-10-20 DIAGNOSIS — Z79899 Other long term (current) drug therapy: Secondary | ICD-10-CM | POA: Insufficient documentation

## 2015-10-20 LAB — RAPID STREP SCREEN (MED CTR MEBANE ONLY): Streptococcus, Group A Screen (Direct): NEGATIVE

## 2015-10-20 MED ORDER — TOBRAMYCIN 0.3 % OP OINT
TOPICAL_OINTMENT | Freq: Four times a day (QID) | OPHTHALMIC | Status: DC
  Administered 2015-10-20: 1 via OPHTHALMIC
  Filled 2015-10-20: qty 3.5

## 2015-10-20 NOTE — ED Provider Notes (Signed)
[facility] Provider Note   CSN: [id number] Arrival date & time: 10/20/15  4098  History   Chief Complaint Chief Complaint  Patient presents with  . Cough    HPI Juan Kelly is a 60 y.o. male.  HPI  Patient presenting with sore throat and L eye redness.   Patient reports sore throat began about three days ago. He has been taking Tylenol and using throat lozenges at home with some pain relief, but says he still has significant pain at night. Also reports redness of his L eye and swelling of his L eyelid beginning about two days ago. Notes discharge in the affected eye, but no changes in vision and no photophobia. Denies cough, fever, chills. Was around a child who was sick with similar symptoms four days ago. Does wear contacts but took them out when eye became red.   Past Medical History:  Diagnosis Date  . Diabetes mellitus   . Hyperlipidemia   . Hypertension   . Myocardial infarction [facility])    Did have elevated cardiac enzymes but normal cath    Patient Active Problem List   Diagnosis Date Noted  . Left knee pain 09/07/2015  . Healthcare maintenance 11/09/2014  . Heart murmur 11/05/2013  . ABNORMAL EKG 07/13/2009  . Diabetes mellitus (HCC) 04/19/2006  . Hyperlipidemia 04/19/2006  . OBESITY, NOS 04/19/2006  . HYPERTENSION, BENIGN SYSTEMIC 04/19/2006    History reviewed. No pertinent surgical history.   Home Medications    Prior to Admission medications   Medication Sig Start Date End Date Taking? Authorizing Provider  amLODipine (NORVASC) 10 MG tablet TAKE ONE TABLET BY MOUTH ONCE DAILY 09/01/15   Ardith Dark, MD  aspirin 81 MG tablet Take 1 tablet (81 mg total) by mouth daily. 11/09/14   Ardith Dark, MD  hydrochlorothiazide (HYDRODIURIL) 25 MG tablet Take 1 tablet (25 mg total) by mouth daily. 09/01/15   Ardith Dark, MD  lisinopril (PRINIVIL,ZESTRIL) 40 MG tablet TAKE ONE TABLET BY MOUTH ONCE DAILY 09/01/15   Ardith Dark, MD  metFORMIN  (GLUCOPHAGE) 1000 MG tablet TAKE ONE TABLET BY MOUTH TWICE DAILY WITH MEALS 11/09/14   Ardith Dark, MD  metoprolol (LOPRESSOR) 100 MG tablet TAKE ONE TABLET BY MOUTH TWICE DAILY 09/01/15   Ardith Dark, MD  Multiple Vitamin (MULTIVITAMIN) capsule Take 1 capsule by mouth daily.      Historical Provider, MD  naproxen (NAPROSYN) 500 MG tablet Take 1 tablet (500 mg total) by mouth 2 (two) times daily. 08/30/15   Renne Crigler, PA-C  niacin 500 MG CR capsule Take 1 capsule (500 mg total) by mouth at bedtime. 11/09/14   Ardith Dark, MD  Omega-3 Fatty Acids (FISH OIL PO) Take by mouth. 3 daily.     Historical Provider, MD  pravastatin (PRAVACHOL) 80 MG tablet Take 1 tablet (80 mg total) by mouth every evening. 03/08/15 03/07/16  Ardith Dark, MD    Family History Family History  Problem Relation Age of Onset  . Diabetes Mother     Social History Social History  Substance Use Topics  . Smoking status: Never Smoker  . Smokeless tobacco: Never Used  . Alcohol use No     Allergies   Review of patient's allergies indicates no known allergies.   Review of Systems Review of Systems  Constitutional: Negative for appetite change, chills and fever.  HENT: Positive for sore throat. Negative for congestion, ear pain, rhinorrhea, sinus pressure, sneezing, trouble swallowing  and voice change.   Eyes: Positive for discharge and redness. Negative for photophobia, pain, itching and visual disturbance.  Respiratory: Negative for shortness of breath.   Gastrointestinal: Negative for abdominal pain, nausea and vomiting.  Allergic/Immunologic: Negative for immunocompromised state.  Neurological: Negative for headaches.   Physical Exam Updated Vital Signs BP 143/90 (BP Location: Left Arm)   Pulse 69   Temp 97.6 F (36.4 C) (Oral)   Resp 18   Ht 5\' 9"  (1.753 m)   Wt 113.4 kg   SpO2 99%   BMI 36.92 kg/m   Physical Exam  Constitutional: He is oriented to person, place, and time. He appears  well-developed and well-nourished.  Sitting up in bed in NAD  HENT:  Head: Normocephalic and atraumatic.  Right Ear: External ear normal.  Left Ear: External ear normal.  Nose: Nose normal.  No oropharyngeal erythema, no tonsillar exudates  Eyes: EOM are normal. Pupils are equal, round, and reactive to light. Left eye exhibits discharge (Minimal watery discharge).  Conjunctival injection of L eye. Swelling of L eyelid. No TTP. No photophobia on exam.   Neck: Normal range of motion. Neck supple.  Cardiovascular: Normal rate, regular rhythm and normal heart sounds.   No murmur heard. Pulmonary/Chest: Effort normal and breath sounds normal. No respiratory distress. He has no wheezes.  Abdominal: Soft. Bowel sounds are normal. He exhibits no distension. There is no tenderness.  Lymphadenopathy:    He has no cervical adenopathy.  Neurological: He is alert and oriented to person, place, and time.  Skin: Skin is warm and dry. No rash noted.  Psychiatric: He has a normal mood and affect. His behavior is normal.   ED Treatments / Results  Labs (all labs ordered are listed, but only abnormal results are displayed) Labs Reviewed  RAPID STREP SCREEN (NOT AT [facility])  CULTURE, GROUP A STREP [facility])    EKG  EKG Interpretation None       Radiology No results found.  Procedures Procedures (including critical care time)  Medications Ordered in ED Medications  tobramycin (TOBREX) 0.3 % ophthalmic ointment (1 application Left Eye Given 10/20/15 [id number])     Initial Impression / Assessment and Plan / ED Course  I have reviewed the triage vital signs and the nursing notes.  Pertinent labs & imaging results that were available during my care of the patient were reviewed by me and considered in my medical decision making (see chart for details).  Clinical Course   Final Clinical Impressions(s) / ED Diagnoses   Final diagnoses:  Sore throat  Redness of left eye   Patient presents with  sore throat and L eye redness. Likely viral etiology. Centor score 1, so strep pharyngitis less likely. Eye without pain and minimal watery discharge, making bacterial conjunctivitis less likely than viral conjunctivitis, however given contact lens use and thus increased risk for bacterial infection, will cover with Tobrex eye drops. Although eye redness is unilateral and patient does wear contacts, less concern for corneal abrasion, as no pain. Continue Tylenol and throat lozenges PRN. Discussed using honey to soothe throat as well. Educated that may take a few days to resolve, and told to leave contacts out.   New Prescriptions New Prescriptions   No medications on file     Marquette SaaAbigail Joseph Maeola Mchaney, MD 10/20/15 [phone]    Marquette SaaAbigail Joseph Hermann Dottavio, MD 10/20/15 [phone]    Cathren LaineKevin Steinl, MD 10/20/15 [phone]

## 2015-10-20 NOTE — ED Triage Notes (Signed)
Patient states that for the last 2 -3 days he has had a cough, sore throat and left eye swelling

## 2015-10-20 NOTE — Discharge Instructions (Signed)
For your sore throat, you can continue to take Tylenol and use throat lozenges as needed. You can also have a spoonful of honey 3-4 times per day, either plain or mixed into a warm beverage. This may be particularly helpful before going to bed.   For your eye, please use the antibiotic eye drops we have provided 4-5 times per day until your symptoms improve. Throw away the contacts you were wearing before your symptoms began, and do not wear your contacts until your redness and eyelid swelling have gone away. Leaving your contacts out will help you eye heal faster.   If your symptoms do not improve, or if you start to develop fevers or chills, please call your regular doctor to schedule an appointment.

## 2015-10-22 LAB — CULTURE, GROUP A STREP (THRC)

## 2015-12-02 ENCOUNTER — Other Ambulatory Visit: Payer: Self-pay | Admitting: Family Medicine

## 2015-12-03 NOTE — Telephone Encounter (Signed)
Rx filled.  Katina Degreealeb M. Jimmey RalphParker, MD [facility] Resident PGY-3 12/03/2015 9:37 AM

## 2015-12-06 NOTE — Telephone Encounter (Signed)
LM for patient to call back.  Please help him schedule an appointment. Jazmin Hartsell,CMA

## 2016-01-07 ENCOUNTER — Ambulatory Visit: Payer: Self-pay | Admitting: Family Medicine

## 2016-02-23 ENCOUNTER — Ambulatory Visit: Payer: Self-pay | Admitting: Family Medicine

## 2016-03-17 ENCOUNTER — Other Ambulatory Visit: Payer: Self-pay | Admitting: Family Medicine

## 2016-07-18 ENCOUNTER — Other Ambulatory Visit: Payer: Self-pay | Admitting: *Deleted

## 2016-07-18 MED ORDER — METOPROLOL TARTRATE 100 MG PO TABS: ORAL_TABLET | ORAL | 0 refills | Status: DC

## 2016-07-18 MED ORDER — AMLODIPINE BESYLATE 10 MG PO TABS: ORAL_TABLET | ORAL | 0 refills | Status: DC

## 2016-07-18 MED ORDER — HYDROCHLOROTHIAZIDE 25 MG PO TABS: 25.0000 mg | ORAL_TABLET | Freq: Every day | ORAL | 0 refills | Status: DC

## 2016-07-18 NOTE — Telephone Encounter (Signed)
30 day supply given. Patient needs appointment for further refills.  Katina Degreealeb M. Jimmey RalphParker, MD [facility] Resident PGY-3 07/18/2016 10:10 AM

## 2016-07-18 NOTE — Telephone Encounter (Signed)
Patient made appt for 08/07/2016. Jazmin Hartsell,CMA

## 2016-08-07 ENCOUNTER — Ambulatory Visit (INDEPENDENT_AMBULATORY_CARE_PROVIDER_SITE_OTHER): Payer: BLUE CROSS/BLUE SHIELD | Admitting: Family Medicine

## 2016-08-07 VITALS — BP 130/80 | HR 98 | Temp 98.3°F | Wt 253.2 lb

## 2016-08-07 DIAGNOSIS — Z0000 Encounter for general adult medical examination without abnormal findings: Secondary | ICD-10-CM

## 2016-08-07 DIAGNOSIS — Z1159 Encounter for screening for other viral diseases: Secondary | ICD-10-CM | POA: Diagnosis not present

## 2016-08-07 DIAGNOSIS — Z125 Encounter for screening for malignant neoplasm of prostate: Secondary | ICD-10-CM | POA: Diagnosis not present

## 2016-08-07 DIAGNOSIS — Z114 Encounter for screening for human immunodeficiency virus [HIV]: Secondary | ICD-10-CM | POA: Diagnosis not present

## 2016-08-07 DIAGNOSIS — E785 Hyperlipidemia, unspecified: Secondary | ICD-10-CM

## 2016-08-07 DIAGNOSIS — I10 Essential (primary) hypertension: Secondary | ICD-10-CM | POA: Diagnosis not present

## 2016-08-07 DIAGNOSIS — E118 Type 2 diabetes mellitus with unspecified complications: Secondary | ICD-10-CM | POA: Diagnosis not present

## 2016-08-07 LAB — POCT GLYCOSYLATED HEMOGLOBIN (HGB A1C): Hemoglobin A1C: 11.8

## 2016-08-07 MED ORDER — METOPROLOL TARTRATE 100 MG PO TABS: ORAL_TABLET | ORAL | 3 refills | Status: DC

## 2016-08-07 MED ORDER — CANAGLIFLOZIN 300 MG PO TABS: 300.0000 mg | ORAL_TABLET | Freq: Every day | ORAL | 3 refills | Status: DC

## 2016-08-07 MED ORDER — LISINOPRIL 40 MG PO TABS: ORAL_TABLET | ORAL | 3 refills | Status: DC

## 2016-08-07 MED ORDER — HYDROCHLOROTHIAZIDE 25 MG PO TABS: 25.0000 mg | ORAL_TABLET | Freq: Every day | ORAL | 3 refills | Status: DC

## 2016-08-07 MED ORDER — AMLODIPINE BESYLATE 10 MG PO TABS: ORAL_TABLET | ORAL | 3 refills | Status: DC

## 2016-08-07 MED ORDER — METFORMIN HCL 1000 MG PO TABS: 1000.0000 mg | ORAL_TABLET | Freq: Two times a day (BID) | ORAL | 3 refills | Status: DC

## 2016-08-07 MED ORDER — NIACIN ER 500 MG PO CPCR: 500.0000 mg | ORAL_CAPSULE | Freq: Every day | ORAL | 3 refills | Status: DC

## 2016-08-07 MED ORDER — PRAVASTATIN SODIUM 80 MG PO TABS: 80.0000 mg | ORAL_TABLET | Freq: Every evening | ORAL | 3 refills | Status: DC

## 2016-08-07 NOTE — Assessment & Plan Note (Addendum)
A1c elevated to 11.8. Discussed treatment options with patient. Patient deferred injectables for now and wishes to add on another oral agent. Will start invokana 300mg  daily. Asked patient to check a few fasting sugars per week. Follow up in 1 month.   Foot exam normal today. Asked patient to schedule eye exam. Due for pnuemonia vaccine - not discussed today due to time constraints.

## 2016-08-07 NOTE — Assessment & Plan Note (Signed)
At goal. Continue metoprolol, lisinopril, HCTZ, and amlodipine. Will check CMET today.

## 2016-08-07 NOTE — Assessment & Plan Note (Signed)
Continue pravastatin and niacin. Check lipid panel today.

## 2016-08-07 NOTE — Patient Instructions (Signed)
Start the invokana.  Check your fasting sugars.   We will check blood work today.  Come back in 1 month to check on your blood sugar.  Take care,  Dr Jimmey RalphParker

## 2016-08-07 NOTE — Progress Notes (Signed)
    Subjective:  Juan Kelly is a 61 y.o. male [facility] with a chief complaint of T2DM.   HPI:  T2DM Currently on metformin 1000mg  twice daily. Reports that he is tolerating this regimen will without side effects. He does not routinely check his CBGs. No polyuria or polydipsia. Has been trying to lose weight and exercise more.   Hypertension BP Readings from Last 3 Encounters:  08/07/16 130/80  10/20/15 143/90  09/02/15 (!) 149/87  Home BP monitoring-No Compliant with medications-yes, without side effects ROS-Denies any CP, HA, SOB, blurry vision, LE edema, transient weakness, orthopnea, PND.   HLD Tolerating pravastatin and niacin without side effects.   ROS: Per HPI  PMH: Smoking history reviewed.   Objective:  Physical Exam: BP 130/80 (BP Location: Right Arm, Patient Position: Sitting, Cuff Size: Large)   Pulse 98   Temp 98.3 F (36.8 C) (Oral)   Wt 253 lb 3.2 oz (114.9 kg)   SpO2 97%   BMI 37.39 kg/m   Gen: NAD, resting comfortably CV: RRR with no murmurs appreciated Pulm: NWOB, CTAB with no crackles, wheezes, or rhonchi GI: Normal bowel sounds present. Soft, Nontender, Nondistended. MSK: no edema, cyanosis, or clubbing noted Skin: warm, dry Neuro: grossly normal, moves all extremities Psych: Normal affect and thought content  Results for orders placed or performed in visit on 08/07/16 (from the past 72 hour(s))  POCT glycosylated hemoglobin (Hb A1C)     Status: Abnormal   Collection Time: 08/07/16  2:27 PM  Result Value Ref Range   Hemoglobin A1C 11.8    Assessment/Plan:  Diabetes mellitus (HCC) A1c elevated to 11.8. Discussed treatment options with patient. Patient deferred injectables for now and wishes to add on another oral agent. Will start invokana 300mg  daily. Asked patient to check a few fasting sugars per week. Follow up in 1 month.   Foot exam normal today. Asked patient to schedule eye exam. Due for pnuemonia vaccine - not  discussed today due to time constraints.   HYPERTENSION, BENIGN SYSTEMIC At goal. Continue metoprolol, lisinopril, HCTZ, and amlodipine. Will check CMET today.   Hyperlipidemia Continue pravastatin and niacin. Check lipid panel today.   Healthcare maintenance HCV and HIV drawn. PSA drawn per patient request. UTD on colon cancer screening.   Katina Degreealeb M. Jimmey RalphParker, MD [facility] Resident PGY-3 08/07/2016 3:07 PM

## 2016-08-07 NOTE — Assessment & Plan Note (Signed)
HCV and HIV drawn. PSA drawn per patient request. UTD on colon cancer screening.

## 2016-08-08 LAB — CMP14+EGFR
A/G RATIO: 1.5 (ref 1.2–2.2)
ALT: 21 IU/L (ref 0–44)
AST: 18 IU/L (ref 0–40)
Albumin: 4.3 g/dL (ref 3.5–5.5)
Alkaline Phosphatase: 91 IU/L (ref 39–117)
BUN / CREAT RATIO: 12 (ref 9–20)
BUN: 14 mg/dL (ref 6–24)
Bilirubin Total: 0.3 mg/dL (ref 0.0–1.2)
CALCIUM: 10.1 mg/dL (ref 8.7–10.2)
CO2: 26 mmol/L (ref 20–29)
Chloride: 102 mmol/L (ref 96–106)
Creatinine, Ser: 1.14 mg/dL (ref 0.76–1.27)
GFR, EST AFRICAN AMERICAN: 81 mL/min/{1.73_m2} (ref >59)
GFR, EST NON AFRICAN AMERICAN: 70 mL/min/{1.73_m2} (ref >59)
GLOBULIN, TOTAL: 2.8 g/dL (ref 1.5–4.5)
Glucose: 169 mg/dL — ABNORMAL HIGH (ref 65–99)
POTASSIUM: 3.7 mmol/L (ref 3.5–5.2)
SODIUM: 144 mmol/L (ref 134–144)
Total Protein: 7.1 g/dL (ref 6.0–8.5)

## 2016-08-08 LAB — CBC
Hematocrit: 38.3 % (ref 37.5–51.0)
Hemoglobin: 12.4 g/dL — ABNORMAL LOW (ref 13.0–17.7)
MCH: 25 pg — AB (ref 26.6–33.0)
MCHC: 32.4 g/dL (ref 31.5–35.7)
MCV: 77 fL — ABNORMAL LOW (ref 79–97)
Platelets: 314 10*3/uL (ref 150–379)
RBC: 4.96 x10E6/uL (ref 4.14–5.80)
RDW: 15.3 % (ref 12.3–15.4)
WBC: 5.7 10*3/uL (ref 3.4–10.8)

## 2016-08-08 LAB — LIPID PANEL
CHOL/HDL RATIO: 3.4 ratio (ref 0.0–5.0)
Cholesterol, Total: 144 mg/dL (ref 100–199)
HDL: 42 mg/dL (ref >39)
LDL CALC: 86 mg/dL (ref 0–99)
Triglycerides: 81 mg/dL (ref 0–149)
VLDL Cholesterol Cal: 16 mg/dL (ref 5–40)

## 2016-08-08 LAB — PSA: PROSTATE SPECIFIC AG, SERUM: 0.7 ng/mL (ref 0.0–4.0)

## 2016-08-08 LAB — HIV ANTIBODY (ROUTINE TESTING W REFLEX): HIV SCREEN 4TH GENERATION: NONREACTIVE

## 2016-08-08 LAB — HEPATITIS C ANTIBODY: Hep C Virus Ab: <0.1 s/co ratio (ref 0.0–0.9)

## 2016-08-09 ENCOUNTER — Encounter: Payer: Self-pay | Admitting: Family Medicine

## 2017-03-13 IMAGING — CR DG KNEE COMPLETE 4+V*L*
4 series · 4 of 4 positions shown · non-contrast
Comparison: None.

CLINICAL DATA: Chronic pain after twisting injury 1 year prior

EXAM:
LEFT KNEE - COMPLETE 4+ VIEW

[t knee ap left]
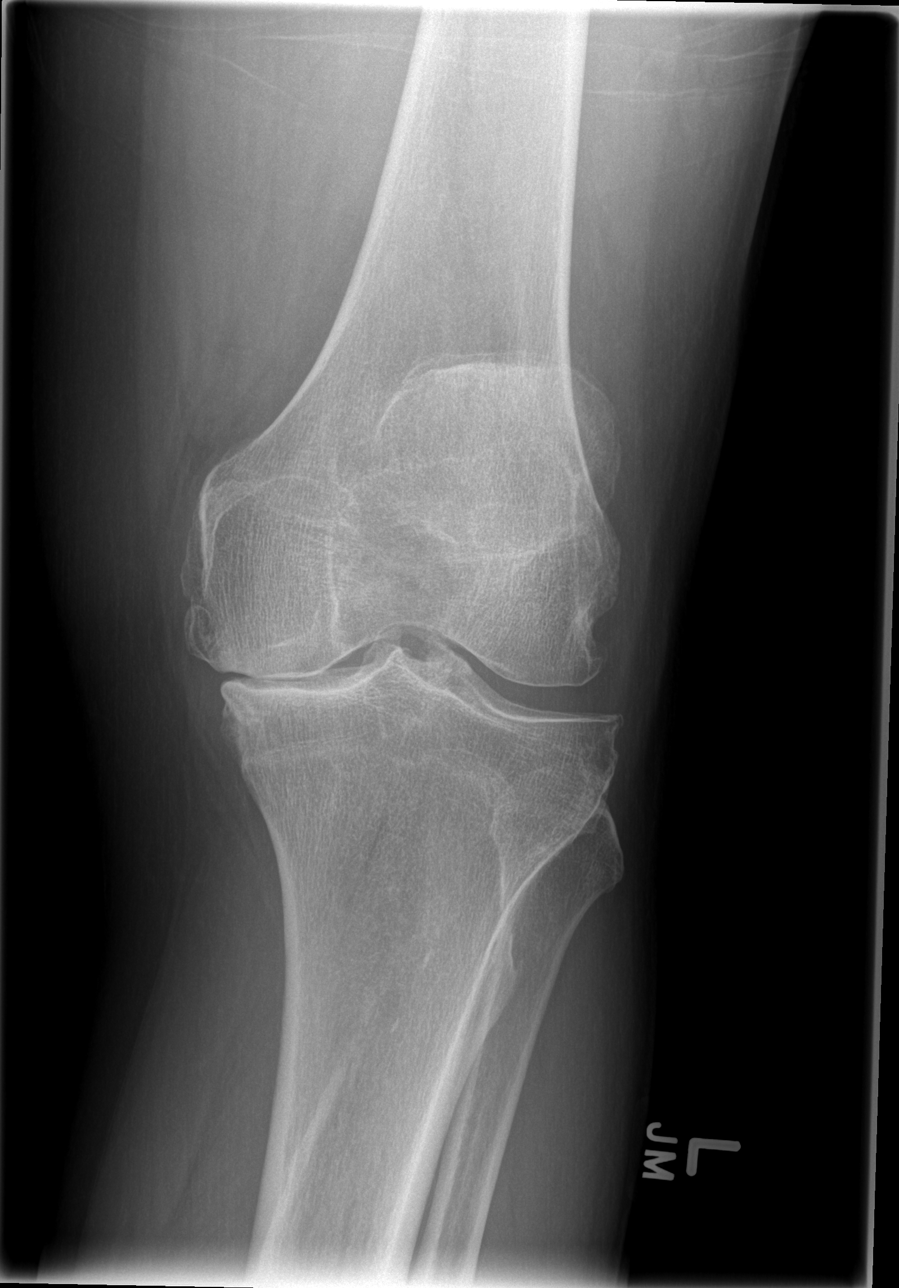

[t knee oblique left (1 of 2)]
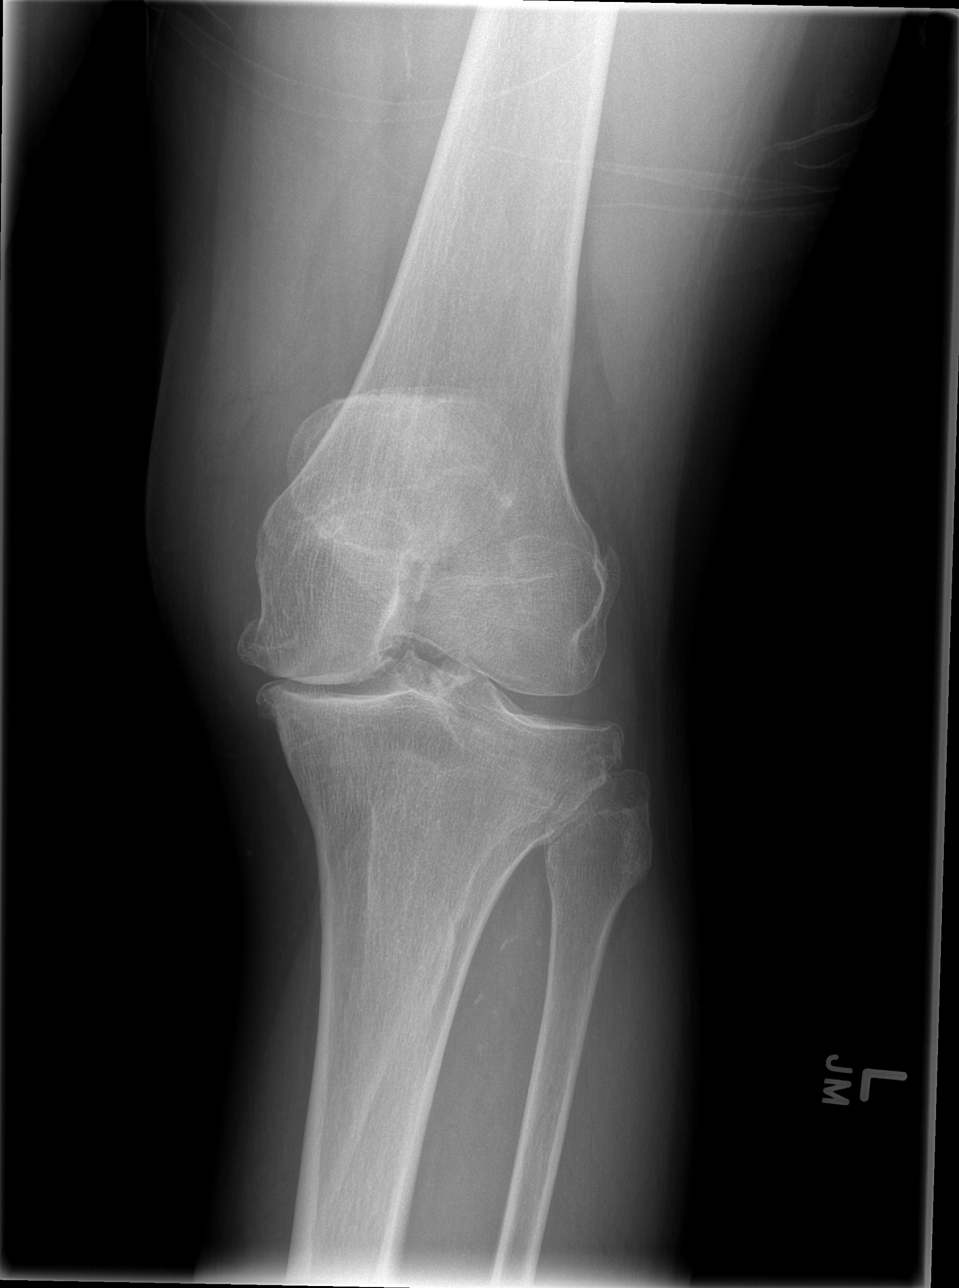

[t knee oblique left (2 of 2)]
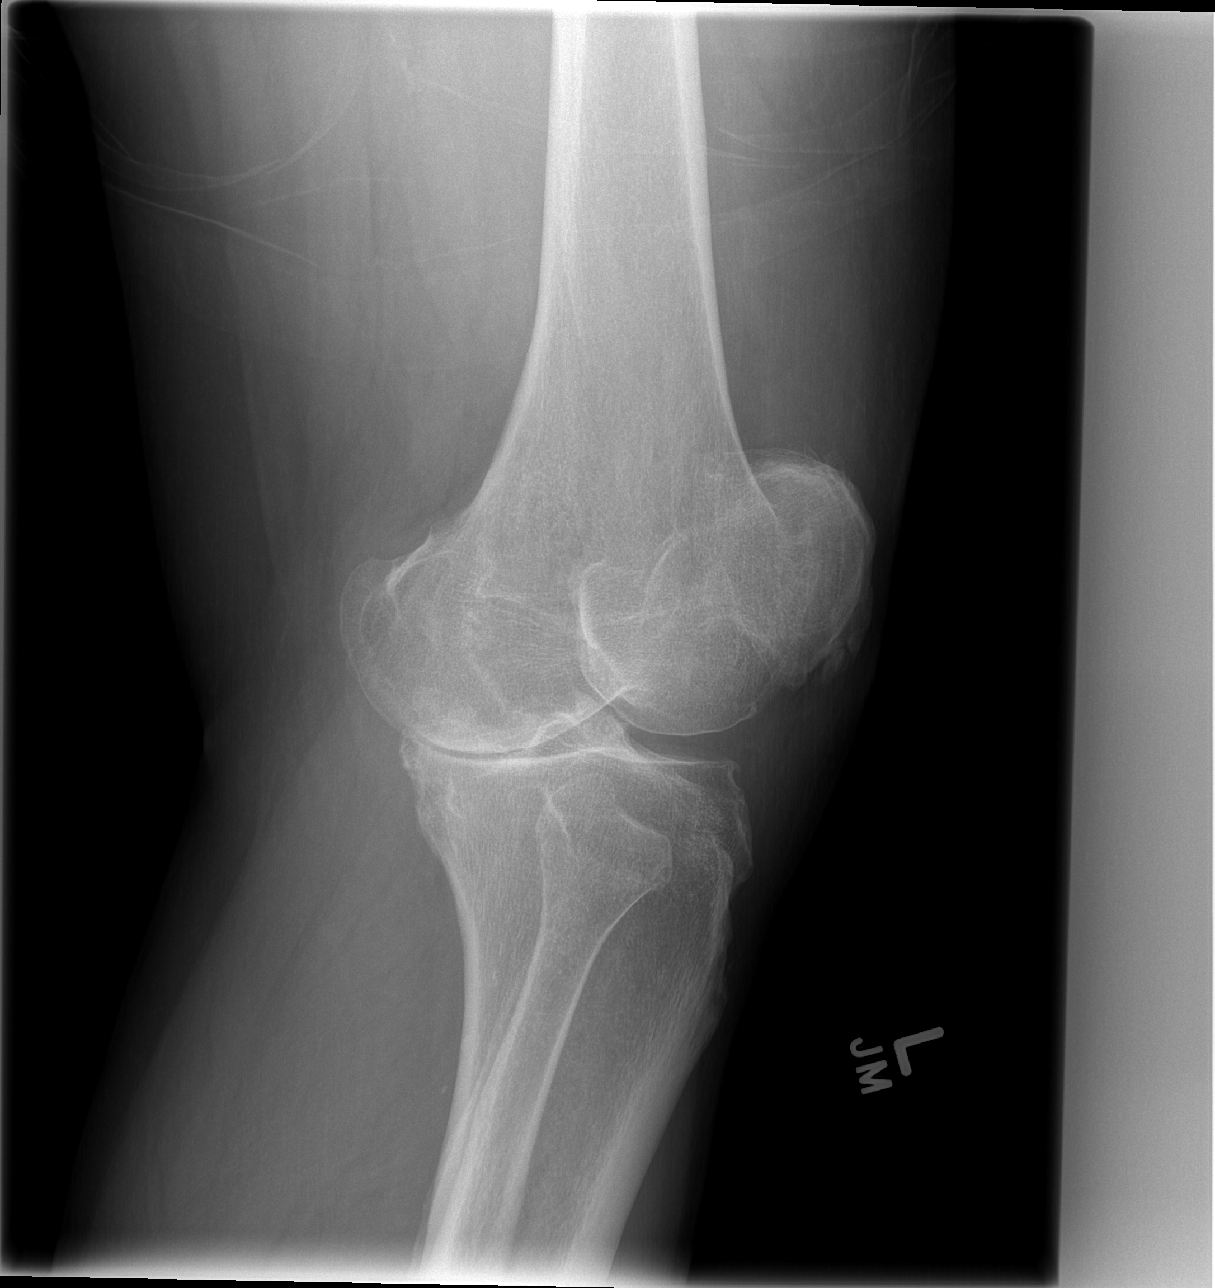

[t knee lat left]
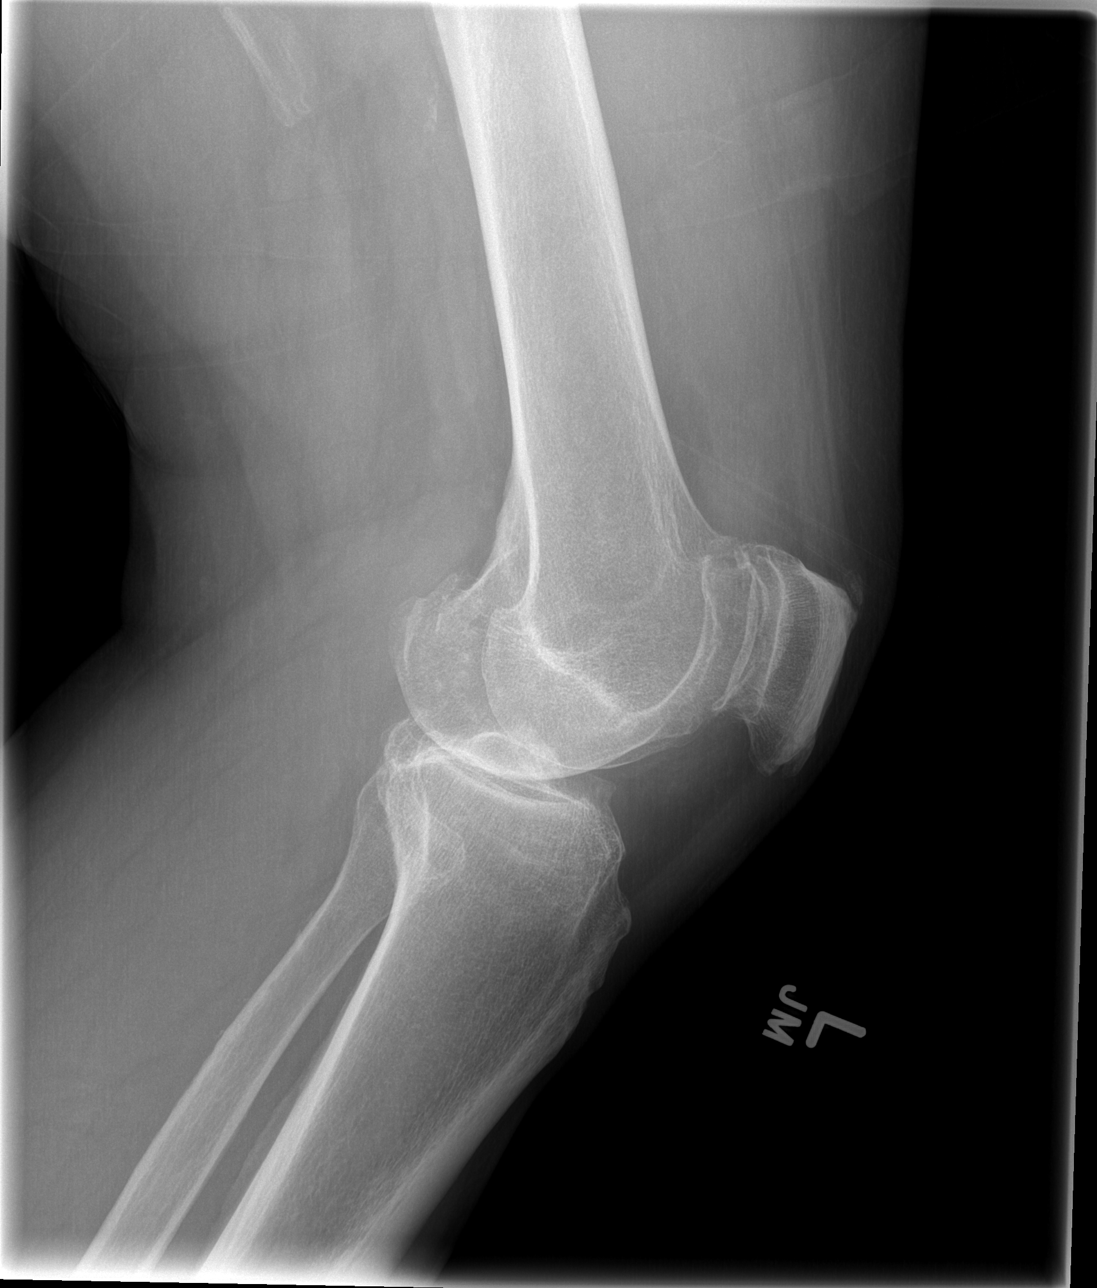

[4 of 4 positions shown; findings below may reference images not displayed]

FINDINGS: Frontal, lateral, and bilateral oblique views were obtained. There
is no acute fracture or dislocation. There is a focal joint
effusion. There is marked narrowing medially. There is moderately
severe narrowing in the patellofemoral joint. There is spurring in
all compartments. No erosive change. There are scattered foci of
vascular calcification.
IMPRESSION: Extensive osteoarthritic change, most marked medially. There is a
joint effusion. No acute fracture or dislocation. There is scattered
foci of atherosclerotic vascular calcification.

## 2017-08-11 ENCOUNTER — Other Ambulatory Visit: Payer: Self-pay | Admitting: Family Medicine

## 2017-08-13 ENCOUNTER — Other Ambulatory Visit: Payer: Self-pay | Admitting: *Deleted

## 2017-08-13 MED ORDER — METFORMIN HCL 1000 MG PO TABS: 1000.0000 mg | ORAL_TABLET | Freq: Two times a day (BID) | ORAL | 3 refills | Status: DC

## 2017-08-13 MED ORDER — AMLODIPINE BESYLATE 10 MG PO TABS
ORAL_TABLET | ORAL | 3 refills | Status: DC
Start: 2017-08-13 — End: 2017-08-14

## 2017-08-13 MED ORDER — METOPROLOL TARTRATE 100 MG PO TABS: ORAL_TABLET | ORAL | 3 refills | Status: DC

## 2017-08-13 MED ORDER — LISINOPRIL 40 MG PO TABS
ORAL_TABLET | ORAL | 3 refills | Status: DC
Start: 2017-08-13 — End: 2017-08-14

## 2017-08-13 MED ORDER — PRAVASTATIN SODIUM 80 MG PO TABS: 80.0000 mg | ORAL_TABLET | Freq: Every evening | ORAL | 3 refills | Status: DC

## 2017-08-13 MED ORDER — HYDROCHLOROTHIAZIDE 25 MG PO TABS: 25.0000 mg | ORAL_TABLET | Freq: Every day | ORAL | 3 refills | Status: DC

## 2017-08-13 MED ORDER — CANAGLIFLOZIN 300 MG PO TABS: 300.0000 mg | ORAL_TABLET | Freq: Every day | ORAL | 3 refills | Status: DC

## 2017-08-13 NOTE — Telephone Encounter (Signed)
Med refill was sent to Dr. Jimmey RalphParker by pharmacy.  Explained that Dr. Jimmey RalphParker is no longer here.  Pt is out of his meds.  Appt made for September 12, 2017.  Will ask provider for one month supply to last till appt. Fleeger, Maryjo RochesterJessica Dawn, CMA

## 2017-08-14 MED ORDER — PRAVASTATIN SODIUM 80 MG PO TABS: 80.0000 mg | ORAL_TABLET | Freq: Every evening | ORAL | 3 refills | Status: DC

## 2017-08-14 MED ORDER — CANAGLIFLOZIN 300 MG PO TABS: 300.0000 mg | ORAL_TABLET | Freq: Every day | ORAL | 3 refills | Status: DC

## 2017-08-14 MED ORDER — METFORMIN HCL 1000 MG PO TABS: 1000.0000 mg | ORAL_TABLET | Freq: Two times a day (BID) | ORAL | 3 refills | Status: DC

## 2017-08-14 MED ORDER — AMLODIPINE BESYLATE 10 MG PO TABS: ORAL_TABLET | ORAL | 3 refills | Status: DC

## 2017-08-14 MED ORDER — METOPROLOL TARTRATE 100 MG PO TABS: ORAL_TABLET | ORAL | 3 refills | Status: DC

## 2017-08-14 MED ORDER — LISINOPRIL 40 MG PO TABS: ORAL_TABLET | ORAL | 3 refills | Status: DC

## 2017-08-14 MED ORDER — HYDROCHLOROTHIAZIDE 25 MG PO TABS: 25.0000 mg | ORAL_TABLET | Freq: Every day | ORAL | 3 refills | Status: DC

## 2017-08-14 NOTE — Addendum Note (Signed)
Addended by: Jone BasemanFLEEGER, Riad Wagley D on: 08/14/2017 11:41 AM   Modules accepted: Orders

## 2017-08-14 NOTE — Telephone Encounter (Signed)
Pt calls again today to say that he does not use [facility] on [address] (he confirmed yesterday).  [facility] [place].  Resent as requested. Nelma Phagan, Maryjo RochesterJessica Dawn, CMA

## 2017-09-12 ENCOUNTER — Ambulatory Visit (INDEPENDENT_AMBULATORY_CARE_PROVIDER_SITE_OTHER): Payer: Self-pay | Admitting: Family Medicine

## 2017-09-12 ENCOUNTER — Encounter: Payer: Self-pay | Admitting: Family Medicine

## 2017-09-12 ENCOUNTER — Other Ambulatory Visit: Payer: Self-pay

## 2017-09-12 VITALS — BP 132/72 | HR 75 | Temp 98.5°F | Ht 69.0 in | Wt 250.0 lb

## 2017-09-12 DIAGNOSIS — E118 Type 2 diabetes mellitus with unspecified complications: Secondary | ICD-10-CM

## 2017-09-12 DIAGNOSIS — I10 Essential (primary) hypertension: Secondary | ICD-10-CM

## 2017-09-12 LAB — POCT GLYCOSYLATED HEMOGLOBIN (HGB A1C): HbA1c, POC (controlled diabetic range): 8.8 % — AB (ref 0.0–7.0)

## 2017-09-12 NOTE — Assessment & Plan Note (Signed)
Blood pressure today is 132/72, essentially at goal.  Continue current regimen of metoprolol, admission, lisinopril, HCTZ.  Follow-up on BMP.

## 2017-09-12 NOTE — Patient Instructions (Signed)
Diabetes Mellitus and Nutrition When you have diabetes (diabetes mellitus), it is very important to have healthy eating habits because your blood sugar (glucose) levels are greatly affected by what you eat and drink. Eating healthy foods in the appropriate amounts, at about the same times every day, can help you:  Control your blood glucose.  Lower your risk of heart disease.  Improve your blood pressure.  Reach or maintain a healthy weight.  Every person with diabetes is different, and each person has different needs for a meal plan. Your health care provider may recommend that you work with a diet and nutrition specialist (dietitian) to make a meal plan that is best for you. Your meal plan may vary depending on factors such as:  The calories you need.  The medicines you take.  Your weight.  Your blood glucose, blood pressure, and cholesterol levels.  Your activity level.  Other health conditions you have, such as heart or kidney disease.  How do carbohydrates affect me? Carbohydrates affect your blood glucose level more than any other type of food. Eating carbohydrates naturally increases the amount of glucose in your blood. Carbohydrate counting is a method for keeping track of how many carbohydrates you eat. Counting carbohydrates is important to keep your blood glucose at a healthy level, especially if you use insulin or take certain oral diabetes medicines. It is important to know how many carbohydrates you can safely have in each meal. This is different for every person. Your dietitian can help you calculate how many carbohydrates you should have at each meal and for snack. Foods that contain carbohydrates include:  Bread, cereal, rice, pasta, and crackers.  Potatoes and corn.  Peas, beans, and lentils.  Milk and yogurt.  Fruit and juice.  Desserts, such as cakes, cookies, ice cream, and candy.  How does alcohol affect me? Alcohol can cause a sudden decrease in blood  glucose (hypoglycemia), especially if you use insulin or take certain oral diabetes medicines. Hypoglycemia can be a life-threatening condition. Symptoms of hypoglycemia (sleepiness, dizziness, and confusion) are similar to symptoms of having too much alcohol. If your health care provider says that alcohol is safe for you, follow these guidelines:  Limit alcohol intake to no more than 1 drink per day for nonpregnant women and 2 drinks per day for men. One drink equals 12 oz of beer, 5 oz of wine, or 1 oz of hard liquor.  Do not drink on an empty stomach.  Keep yourself hydrated with water, diet soda, or unsweetened iced tea.  Keep in mind that regular soda, juice, and other mixers may contain a lot of sugar and must be counted as carbohydrates.  What are tips for following this plan? Reading food labels  Start by checking the serving size on the label. The amount of calories, carbohydrates, fats, and other nutrients listed on the label are based on one serving of the food. Many foods contain more than one serving per package.  Check the total grams (g) of carbohydrates in one serving. You can calculate the number of servings of carbohydrates in one serving by dividing the total carbohydrates by 15. For example, if a food has 30 g of total carbohydrates, it would be equal to 2 servings of carbohydrates.  Check the number of grams (g) of saturated and trans fats in one serving. Choose foods that have low or no amount of these fats.  Check the number of milligrams (mg) of sodium in one serving. Most people   should limit total sodium intake to less than 2,300 mg per day.  Always check the nutrition information of foods labeled as "low-fat" or "nonfat". These foods may be higher in added sugar or refined carbohydrates and should be avoided.  Talk to your dietitian to identify your daily goals for nutrients listed on the label. Shopping  Avoid buying canned, premade, or processed foods. These  foods tend to be high in fat, sodium, and added sugar.  Shop around the outside edge of the grocery store. This includes fresh fruits and vegetables, bulk grains, fresh meats, and fresh dairy. Cooking  Use low-heat cooking methods, such as baking, instead of high-heat cooking methods like deep frying.  Cook using healthy oils, such as olive, canola, or sunflower oil.  Avoid cooking with butter, cream, or high-fat meats. Meal planning  Eat meals and snacks regularly, preferably at the same times every day. Avoid going long periods of time without eating.  Eat foods high in fiber, such as fresh fruits, vegetables, beans, and whole grains. Talk to your dietitian about how many servings of carbohydrates you can eat at each meal.  Eat 4-6 ounces of lean protein each day, such as lean meat, chicken, fish, eggs, or tofu. 1 ounce is equal to 1 ounce of meat, chicken, or fish, 1 egg, or 1/4 cup of tofu.  Eat some foods each day that contain healthy fats, such as avocado, nuts, seeds, and fish. Lifestyle   Check your blood glucose regularly.  Exercise at least 30 minutes 5 or more days each week, or as told by your health care provider.  Take medicines as told by your health care provider.  Do not use any products that contain nicotine or tobacco, such as cigarettes and e-cigarettes. If you need help quitting, ask your health care provider.  Work with a counselor or diabetes educator to identify strategies to manage stress and any emotional and social challenges. What are some questions to ask my health care provider?  Do I need to meet with a diabetes educator?  Do I need to meet with a dietitian?  What number can I call if I have questions?  When are the best times to check my blood glucose? Where to find more information:  American Diabetes Association: diabetes.org/food-and-fitness/food  Academy of Nutrition and Dietetics:  www.eatright.org/resources/health/diseases-and-conditions/diabetes  National Institute of Diabetes and Digestive and Kidney Diseases (NIH): www.niddk.nih.gov/health-information/diabetes/overview/diet-eating-physical-activity Summary  A healthy meal plan will help you control your blood glucose and maintain a healthy lifestyle.  Working with a diet and nutrition specialist (dietitian) can help you make a meal plan that is best for you.  Keep in mind that carbohydrates and alcohol have immediate effects on your blood glucose levels. It is important to count carbohydrates and to use alcohol carefully. This information is not intended to replace advice given to you by your health care provider. Make sure you discuss any questions you have with your health care provider. Document Released: 11/03/2004 Document Revised: 03/13/2016 Document Reviewed: 03/13/2016 Elsevier Interactive Patient Education  2018 [employer]  

## 2017-09-12 NOTE — Assessment & Plan Note (Signed)
Patient A1c today is 8.8 down from 11.8 about a year ago. Patient reports good adherence to medication regimen. He has work of therapeutic lifestyle changes. Kidney and liver function normal at last office visit. Given significant improvement in A1c despite lack of adherence to current medication regimen, will not make any changes.  Will discuss with patient ways to improve adherence.  We will continue to work on diet and exercise.  He will follow-up in 3 months for A1c recheck. --Continue metformin 2000 mg daily --Continue Invokana 300 mg daily --Handout given for diabetic diet --Recheck CBC, CMP, lipid panel

## 2017-09-12 NOTE — Progress Notes (Signed)
   Subjective:    Patient ID: Juan Kelly, male    DOB: 03-21-1955, 62 y.o.   MRN: [id number]   CC: Meet new PCP  HPI: Patient is a 62 yo male with a past medical history significant for T2DM, HTN, HLD and MI who present today to meet new PCP. Patient does not have any acute symptoms today. He was last seen in clinic over a year ago. He had poorly controlled T2DM with an A1c of 11.8 and had a slight anemia. Patient reports that recently he was not able to take his Invokana because he was told by his pharmacy that his copay was too high.  Patient also reports he has not been taking his metformin as prescribed and has a tendency to forget his evening dose.  Patient was instructed to try to apply for assistance.  Kidney and liver function at last office visit were normal. Patient reports he has tried to modify his diet over the past year and is moderately active. Patient denies any chest pain, SOB, abdominal pain, nausea, vomiting, headaches, polyuria and polydipsia.   Smoking status reviewed   ROS: all other systems were reviewed and are negative other than in the HPI   Past Medical History:  Diagnosis Date  . Diabetes mellitus   . Hyperlipidemia   . Hypertension   . Myocardial infarction [facility](HCC)    Did have elevated cardiac enzymes but normal cath    History reviewed. No pertinent surgical history.  Past medical history, surgical, family, and social history reviewed and updated in the EMR as appropriate.  Objective:  BP 132/72   Pulse 75   Temp 98.5 F (36.9 C) (Oral)   Ht 5\' 9"  (1.753 m)   Wt 250 lb (113.4 kg)   SpO2 98%   BMI 36.92 kg/m   Vitals and nursing note reviewed  General: NAD, pleasant, able to participate in exam Cardiac: RRR, normal heart sounds, no murmurs. 2+ radial and PT pulses bilaterally Respiratory: CTAB, normal effort, No wheezes, rales or rhonchi Abdomen: soft, nontender, nondistended, no hepatic or splenomegaly, +BS Extremities: no edema or cyanosis.  WWP. Skin: warm and dry, no rashes noted Neuro: alert and oriented x4, no focal deficits Psych: Normal affect and mood   Assessment & Plan:    Diabetes mellitus (HCC) Patient A1c today is 8.8 down from 11.8 about a year ago. Patient reports good adherence to medication regimen. He has work of therapeutic lifestyle changes. Kidney and liver function normal at last office visit. Given significant improvement in A1c despite lack of adherence to current medication regimen, will not make any changes.  Will discuss with patient ways to improve adherence.  We will continue to work on diet and exercise.  He will follow-up in 3 months for A1c recheck. --Continue metformin 2000 mg daily --Continue Invokana 300 mg daily --Handout given for diabetic diet --Recheck CBC, CMP, lipid panel  HYPERTENSION, BENIGN SYSTEMIC Blood pressure today is 132/72, essentially at goal.  Continue current regimen of metoprolol, admission, lisinopril, HCTZ.  Follow-up on BMP.    Lovena NeighboursAbdoulaye Vianney Kopecky, MD [facility] PGY-3

## 2017-09-13 ENCOUNTER — Other Ambulatory Visit: Payer: Self-pay | Admitting: Family Medicine

## 2017-09-13 DIAGNOSIS — E785 Hyperlipidemia, unspecified: Secondary | ICD-10-CM

## 2017-09-13 LAB — COMPREHENSIVE METABOLIC PANEL
ALBUMIN: 4.3 g/dL (ref 3.6–4.8)
ALT: 21 IU/L (ref 0–44)
AST: 13 IU/L (ref 0–40)
Albumin/Globulin Ratio: 1.4 (ref 1.2–2.2)
Alkaline Phosphatase: 96 IU/L (ref 39–117)
BUN / CREAT RATIO: 16 (ref 10–24)
BUN: 23 mg/dL (ref 8–27)
Bilirubin Total: 0.2 mg/dL (ref 0.0–1.2)
CALCIUM: 9.8 mg/dL (ref 8.6–10.2)
CO2: 27 mmol/L (ref 20–29)
Chloride: 103 mmol/L (ref 96–106)
Creatinine, Ser: 1.45 mg/dL — ABNORMAL HIGH (ref 0.76–1.27)
GFR, EST AFRICAN AMERICAN: 60 mL/min/{1.73_m2} (ref >59)
GFR, EST NON AFRICAN AMERICAN: 52 mL/min/{1.73_m2} — AB (ref >59)
GLUCOSE: 200 mg/dL — AB (ref 65–99)
Globulin, Total: 3.1 g/dL (ref 1.5–4.5)
Potassium: 3.9 mmol/L (ref 3.5–5.2)
Sodium: 145 mmol/L — ABNORMAL HIGH (ref 134–144)
TOTAL PROTEIN: 7.4 g/dL (ref 6.0–8.5)

## 2017-09-13 LAB — LIPID PANEL
CHOL/HDL RATIO: 5.6 ratio — AB (ref 0.0–5.0)
Cholesterol, Total: 200 mg/dL — ABNORMAL HIGH (ref 100–199)
HDL: 36 mg/dL — AB (ref >39)
LDL CALC: 112 mg/dL — AB (ref 0–99)
Triglycerides: 259 mg/dL — ABNORMAL HIGH (ref 0–149)
VLDL Cholesterol Cal: 52 mg/dL — ABNORMAL HIGH (ref 5–40)

## 2017-09-13 LAB — CBC WITH DIFFERENTIAL/PLATELET
Basophils Absolute: 0.1 10*3/uL (ref 0.0–0.2)
Basos: 1 %
EOS (ABSOLUTE): 0.1 10*3/uL (ref 0.0–0.4)
EOS: 1 %
HEMATOCRIT: 39.8 % (ref 37.5–51.0)
Hemoglobin: 13.2 g/dL (ref 13.0–17.7)
Immature Grans (Abs): 0 10*3/uL (ref 0.0–0.1)
Immature Granulocytes: 0 %
Lymphocytes Absolute: 2.1 10*3/uL (ref 0.7–3.1)
Lymphs: 28 %
MCH: 25.9 pg — ABNORMAL LOW (ref 26.6–33.0)
MCHC: 33.2 g/dL (ref 31.5–35.7)
MCV: 78 fL — ABNORMAL LOW (ref 79–97)
MONOS ABS: 0.5 10*3/uL (ref 0.1–0.9)
Monocytes: 6 %
Neutrophils Absolute: 4.8 10*3/uL (ref 1.4–7.0)
Neutrophils: 64 %
PLATELETS: 295 10*3/uL (ref 150–450)
RBC: 5.1 x10E6/uL (ref 4.14–5.80)
RDW: 15.8 % — AB (ref 12.3–15.4)
WBC: 7.4 10*3/uL (ref 3.4–10.8)

## 2017-09-13 MED ORDER — ROSUVASTATIN CALCIUM 20 MG PO TABS: 20.0000 mg | ORAL_TABLET | Freq: Every day | ORAL | 3 refills | Status: DC

## 2018-07-08 ENCOUNTER — Encounter: Payer: Self-pay | Admitting: Internal Medicine

## 2018-08-07 ENCOUNTER — Other Ambulatory Visit: Payer: Self-pay | Admitting: Family Medicine

## 2018-09-23 ENCOUNTER — Encounter: Payer: Self-pay | Admitting: Internal Medicine

## 2018-11-05 ENCOUNTER — Other Ambulatory Visit: Payer: Self-pay | Admitting: *Deleted

## 2018-11-05 DIAGNOSIS — E785 Hyperlipidemia, unspecified: Secondary | ICD-10-CM

## 2018-11-06 MED ORDER — HYDROCHLOROTHIAZIDE 25 MG PO TABS: 25.0000 mg | ORAL_TABLET | Freq: Every day | ORAL | 0 refills | Status: DC

## 2018-11-06 MED ORDER — ROSUVASTATIN CALCIUM 20 MG PO TABS: 20.0000 mg | ORAL_TABLET | Freq: Every day | ORAL | 3 refills | Status: DC

## 2018-11-06 MED ORDER — METOPROLOL TARTRATE 100 MG PO TABS: ORAL_TABLET | ORAL | 0 refills | Status: DC

## 2018-11-06 MED ORDER — AMLODIPINE BESYLATE 10 MG PO TABS: ORAL_TABLET | ORAL | 0 refills | Status: DC

## 2018-11-06 MED ORDER — LISINOPRIL 40 MG PO TABS: ORAL_TABLET | ORAL | 0 refills | Status: DC

## 2018-11-06 MED ORDER — METFORMIN HCL 1000 MG PO TABS: 1000.0000 mg | ORAL_TABLET | Freq: Two times a day (BID) | ORAL | 0 refills | Status: DC

## 2019-01-29 ENCOUNTER — Other Ambulatory Visit: Payer: Self-pay | Admitting: Family Medicine

## 2019-04-28 ENCOUNTER — Other Ambulatory Visit: Payer: Self-pay | Admitting: Family Medicine

## 2019-07-29 ENCOUNTER — Other Ambulatory Visit: Payer: Self-pay | Admitting: Family Medicine

## 2019-08-10 ENCOUNTER — Other Ambulatory Visit: Payer: Self-pay | Admitting: Family Medicine

## 2019-10-06 ENCOUNTER — Other Ambulatory Visit: Payer: Self-pay

## 2019-10-06 MED ORDER — METFORMIN HCL 1000 MG PO TABS: 1000.0000 mg | ORAL_TABLET | Freq: Two times a day (BID) | ORAL | 3 refills | Status: DC

## 2019-10-06 NOTE — Telephone Encounter (Signed)
Received phone call from pharmacist requesting early refill of metformin. Per pharmacist, patient has misplaced prescription and needs a new one as soon as possible.   To PCP  Veronda Prude, RN

## 2019-11-13 ENCOUNTER — Other Ambulatory Visit: Payer: Self-pay

## 2019-11-13 MED ORDER — AMLODIPINE BESYLATE 10 MG PO TABS: 10.0000 mg | ORAL_TABLET | Freq: Every day | ORAL | 0 refills | Status: AC

## 2019-11-13 MED ORDER — HYDROCHLOROTHIAZIDE 25 MG PO TABS: 25.0000 mg | ORAL_TABLET | Freq: Every day | ORAL | 0 refills | Status: DC

## 2019-11-13 MED ORDER — METOPROLOL TARTRATE 100 MG PO TABS: 100.0000 mg | ORAL_TABLET | Freq: Two times a day (BID) | ORAL | 0 refills | Status: AC

## 2019-11-13 NOTE — Telephone Encounter (Signed)
This patient has not been seen in our clinic for 2 years.  I have provided a 1 month refill on his medications.  He will not be given any additional refills until he is seen in clinic.

## 2019-11-14 ENCOUNTER — Other Ambulatory Visit: Payer: Self-pay

## 2019-11-14 DIAGNOSIS — E785 Hyperlipidemia, unspecified: Secondary | ICD-10-CM

## 2019-11-17 ENCOUNTER — Other Ambulatory Visit: Payer: Self-pay

## 2019-11-17 MED ORDER — ROSUVASTATIN CALCIUM 20 MG PO TABS: 20.0000 mg | ORAL_TABLET | Freq: Every day | ORAL | 0 refills | Status: DC

## 2019-11-17 MED ORDER — LISINOPRIL 40 MG PO TABS: 40.0000 mg | ORAL_TABLET | Freq: Every day | ORAL | 0 refills | Status: DC

## 2019-11-17 NOTE — Telephone Encounter (Signed)
Spoke with pt. He informed me that he is no longer a patient at this clinic. I will remove his name of the list of patients. Aquilla Solian, CMA

## 2019-11-17 NOTE — Telephone Encounter (Signed)
  Disregard refill for today. Spoke with patient and he Informed me that he has switched  [facility] and is no longer going to this clinic. Juan Kelly, CMA

## 2019-11-17 NOTE — Telephone Encounter (Signed)
I have provided 30 days of medicine.  He will need to be seen in the office before any additional medications prescribed.  Has not been seen in our office for over 2 years. Mirian Mo, MD

## 2019-12-19 ENCOUNTER — Other Ambulatory Visit: Payer: Self-pay | Admitting: Family Medicine

## 2019-12-19 DIAGNOSIS — E785 Hyperlipidemia, unspecified: Secondary | ICD-10-CM

## 2020-10-29 ENCOUNTER — Other Ambulatory Visit: Payer: Self-pay | Admitting: Family Medicine

## 2021-10-28 ENCOUNTER — Emergency Department (HOSPITAL_BASED_OUTPATIENT_CLINIC_OR_DEPARTMENT_OTHER): Payer: BLUE CROSS/BLUE SHIELD

## 2021-10-28 ENCOUNTER — Emergency Department (HOSPITAL_COMMUNITY): Payer: BLUE CROSS/BLUE SHIELD

## 2021-10-28 ENCOUNTER — Other Ambulatory Visit: Payer: Self-pay

## 2021-10-28 ENCOUNTER — Emergency Department (HOSPITAL_BASED_OUTPATIENT_CLINIC_OR_DEPARTMENT_OTHER)
Admission: EM | Admit: 2021-10-28 | Discharge: 2021-10-28 | Payer: BLUE CROSS/BLUE SHIELD | Attending: Emergency Medicine | Admitting: Emergency Medicine

## 2021-10-28 ENCOUNTER — Encounter (HOSPITAL_BASED_OUTPATIENT_CLINIC_OR_DEPARTMENT_OTHER): Payer: Self-pay

## 2021-10-28 DIAGNOSIS — Z7982 Long term (current) use of aspirin: Secondary | ICD-10-CM | POA: Diagnosis not present

## 2021-10-28 DIAGNOSIS — I10 Essential (primary) hypertension: Secondary | ICD-10-CM | POA: Diagnosis not present

## 2021-10-28 DIAGNOSIS — Z7984 Long term (current) use of oral hypoglycemic drugs: Secondary | ICD-10-CM | POA: Insufficient documentation

## 2021-10-28 DIAGNOSIS — R2990 Unspecified symptoms and signs involving the nervous system: Secondary | ICD-10-CM | POA: Diagnosis not present

## 2021-10-28 DIAGNOSIS — E119 Type 2 diabetes mellitus without complications: Secondary | ICD-10-CM | POA: Diagnosis not present

## 2021-10-28 DIAGNOSIS — Z20822 Contact with and (suspected) exposure to covid-19: Secondary | ICD-10-CM | POA: Insufficient documentation

## 2021-10-28 DIAGNOSIS — R4701 Aphasia: Secondary | ICD-10-CM | POA: Diagnosis not present

## 2021-10-28 DIAGNOSIS — I6523 Occlusion and stenosis of bilateral carotid arteries: Secondary | ICD-10-CM | POA: Diagnosis not present

## 2021-10-28 DIAGNOSIS — Z79899 Other long term (current) drug therapy: Secondary | ICD-10-CM | POA: Insufficient documentation

## 2021-10-28 HISTORY — DX: Cerebral infarction, unspecified: I63.9

## 2021-10-28 LAB — DIFFERENTIAL
Abs Immature Granulocytes: 0.02 10*3/uL (ref 0.00–0.07)
Basophils Absolute: 0.1 10*3/uL (ref 0.0–0.1)
Basophils Relative: 1 %
Eosinophils Absolute: 0.1 10*3/uL (ref 0.0–0.5)
Eosinophils Relative: 1 %
Immature Granulocytes: 0 %
Lymphocytes Relative: 27 %
Lymphs Abs: 1.8 10*3/uL (ref 0.7–4.0)
Monocytes Absolute: 0.6 10*3/uL (ref 0.1–1.0)
Monocytes Relative: 8 %
Neutro Abs: 4.1 10*3/uL (ref 1.7–7.7)
Neutrophils Relative %: 63 %

## 2021-10-28 LAB — COMPREHENSIVE METABOLIC PANEL
ALT: 34 U/L (ref 0–44)
AST: 28 U/L (ref 15–41)
Albumin: 4.1 g/dL (ref 3.5–5.0)
Alkaline Phosphatase: 85 U/L (ref 38–126)
Anion gap: 8 (ref 5–15)
BUN: 24 mg/dL — ABNORMAL HIGH (ref 8–23)
CO2: 26 mmol/L (ref 22–32)
Calcium: 9.2 mg/dL (ref 8.9–10.3)
Chloride: 104 mmol/L (ref 98–111)
Creatinine, Ser: 1.44 mg/dL — ABNORMAL HIGH (ref 0.61–1.24)
GFR, Estimated: 54 mL/min — ABNORMAL LOW (ref >60)
Glucose, Bld: 264 mg/dL — ABNORMAL HIGH (ref 70–99)
Potassium: 3.8 mmol/L (ref 3.5–5.1)
Sodium: 138 mmol/L (ref 135–145)
Total Bilirubin: 0.9 mg/dL (ref 0.3–1.2)
Total Protein: 7.8 g/dL (ref 6.5–8.1)

## 2021-10-28 LAB — CBC
HCT: 41.8 % (ref 39.0–52.0)
Hemoglobin: 13.3 g/dL (ref 13.0–17.0)
MCH: 25 pg — ABNORMAL LOW (ref 26.0–34.0)
MCHC: 31.8 g/dL (ref 30.0–36.0)
MCV: 78.7 fL — ABNORMAL LOW (ref 80.0–100.0)
Platelets: 224 10*3/uL (ref 150–400)
RBC: 5.31 MIL/uL (ref 4.22–5.81)
RDW: 15.5 % (ref 11.5–15.5)
WBC: 6.7 10*3/uL (ref 4.0–10.5)
nRBC: 0 % (ref 0.0–0.2)

## 2021-10-28 LAB — PROTIME-INR
INR: 1 (ref 0.8–1.2)
Prothrombin Time: 13.3 seconds (ref 11.4–15.2)

## 2021-10-28 LAB — RESP PANEL BY RT-PCR (FLU A&B, COVID) ARPGX2
Influenza A by PCR: NEGATIVE
Influenza B by PCR: NEGATIVE
SARS Coronavirus 2 by RT PCR: NEGATIVE

## 2021-10-28 LAB — CBG MONITORING, ED: Glucose-Capillary: 257 mg/dL — ABNORMAL HIGH (ref 70–99)

## 2021-10-28 LAB — ETHANOL: Alcohol, Ethyl (B): <10 mg/dL (ref <10)

## 2021-10-28 LAB — APTT: aPTT: 28 seconds (ref 24–36)

## 2021-10-28 MED ORDER — IOHEXOL 350 MG/ML SOLN
100.0000 mL | Freq: Once | INTRAVENOUS | Status: AC | PRN
  Administered 2021-10-28: 60 mL via INTRAVENOUS

## 2021-10-28 MED ORDER — IOHEXOL 350 MG/ML SOLN
100.0000 mL | Freq: Once | INTRAVENOUS | Status: AC | PRN
Start: 2021-10-28 — End: 2021-10-28
  Administered 2021-10-28: 100 mL via INTRAVENOUS

## 2021-10-28 NOTE — Progress Notes (Signed)
CT attempted to obtain pt for Code Stroke Head, teleneuro visit just beginning, and Per RN another tube of blood needed to be collected, stated would call when ready

## 2021-10-28 NOTE — ED Notes (Signed)
Teleneuro taken into Patient room

## 2021-10-28 NOTE — Progress Notes (Signed)
I spoke with Dr. Renaye Rakers and recommended stat ED to ED transfer.  He is arranging that. I have also discussed the plan with Dr. Iver Nestle at [facility]. I also spoke with [facility] and told them that this transfer needs to happen emergently although this is not a IR case, he still needs to be at [facility] ASAP should his clinical status change and should he require further advanced imaging including CT perfusion which is not available at [facility].  [facility] operator verbalized understanding.  Please call the on-call neuro hospitalist as soon as the patient arrives to the ER  -- Milon Dikes, MD Neurologist [facility] Pager: [phone]

## 2021-10-28 NOTE — ED Notes (Signed)
Neuro at bedside.

## 2021-10-28 NOTE — ED Provider Notes (Signed)
[facility] EMERGENCY DEPARTMENT Provider Note   CSN: [id number] Arrival date & time: 10/28/21  1332  An emergency department physician performed an initial assessment on this suspected stroke patient at 1354.  History  Chief Complaint  Patient presents with   Aphasia    Juan Kelly is a 66 y.o. male with a prior history of left frontal left parietal occipital infarct in 2022, present emerged department with aphasia and difficulty speaking.  Supplemental history provided by the patient's family at bedside.  They report the patient was last normal around 6:30 PM when he spoke on the phone.  Around 1 AM the family member checked in with the patient and his speech seemed slurred.  This morning they called again to check in on the patient at lunchtime and his speech was very slurred and he appeared confused.  The symptoms are consistent with his prior stroke symptoms.  Chart reviewed including external records.  Patient has a history of hypertension and diabetes.  HPI     Home Medications Prior to Admission medications   Medication Sig Start Date End Date Taking? Authorizing Provider  amLODipine (NORVASC) 10 MG tablet Take 1 tablet (10 mg total) by mouth daily. 11/13/19   Mirian Mo, MD  aspirin 81 MG tablet Take 1 tablet (81 mg total) by mouth daily. 11/09/14   Ardith Dark, MD  canagliflozin [facility]) 300 MG TABS tablet Take 1 tablet (300 mg total) by mouth daily before breakfast. 08/14/17   Diallo, Lilia Argue, MD  hydrochlorothiazide (HYDRODIURIL) 25 MG tablet Take 1 tablet (25 mg total) by mouth daily. 11/13/19   Mirian Mo, MD  lisinopril (ZESTRIL) 40 MG tablet Take 1 tablet (40 mg total) by mouth daily. 11/17/19   Mirian Mo, MD  metFORMIN (GLUCOPHAGE) 1000 MG tablet Take 1 tablet (1,000 mg total) by mouth 2 (two) times daily with a meal. 10/06/19   Mirian Mo, MD  metoprolol tartrate (LOPRESSOR) 100 MG tablet Take 1 tablet (100 mg total) by mouth 2 (two) times daily.  11/13/19   Mirian Mo, MD  Multiple Vitamin (MULTIVITAMIN) capsule Take 1 capsule by mouth daily.      [provider]  niacin 500 MG CR capsule Take 1 capsule (500 mg total) by mouth at bedtime. 08/07/16   Ardith Dark, MD  Omega-3 Fatty Acids (FISH OIL PO) Take by mouth. 3 daily.     [provider]  rosuvastatin (CRESTOR) 20 MG tablet Take 1 tablet (20 mg total) by mouth daily. 11/17/19   Mirian Mo, MD      Allergies    Patient has no known allergies.    Review of Systems   Review of Systems  Physical Exam Updated Vital Signs BP (!) 149/78   Pulse 68   Temp 98 F (36.7 C) (Oral)   Resp 16   Ht 5\' 9"  (1.753 m)   Wt 112.3 kg   SpO2 97%   BMI 36.56 kg/m  Physical Exam Constitutional:      General: He is not in acute distress. HENT:     Head: Normocephalic and atraumatic.  Eyes:     Conjunctiva/sclera: Conjunctivae normal.     Pupils: Pupils are equal, round, and reactive to light.  Cardiovascular:     Rate and Rhythm: Normal rate and regular rhythm.  Pulmonary:     Effort: Pulmonary effort is normal. No respiratory distress.  Skin:    General: Skin is warm and dry.  Neurological:     General:  No focal deficit present.     Mental Status: He is alert. Mental status is at baseline.     Comments: Expressive aphasia, difficulty with finger-to-nose with right and left handed No facial droop, extraocular motion intact, no hemineglect, good strength in the lower and upper extremities, no pronator drift  Psychiatric:        Mood and Affect: Mood normal.        Behavior: Behavior normal.     ED Results / Procedures / Treatments   Labs (all labs ordered are listed, but only abnormal results are displayed) Labs Reviewed  CBC - Abnormal; Notable for the following components:      Result Value   MCV 78.7 (*)    MCH 25.0 (*)    All other components within normal limits  COMPREHENSIVE METABOLIC PANEL - Abnormal; Notable for the following components:    Glucose, Bld 264 (*)    BUN 24 (*)    Creatinine, Ser 1.44 (*)    GFR, Estimated 54 (*)    All other components within normal limits  CBG MONITORING, ED - Abnormal; Notable for the following components:   Glucose-Capillary 257 (*)    All other components within normal limits  RESP PANEL BY RT-PCR (FLU A&B, COVID) ARPGX2  ETHANOL  PROTIME-INR  APTT  DIFFERENTIAL  RAPID URINE DRUG SCREEN, HOSP PERFORMED  URINALYSIS, ROUTINE W REFLEX MICROSCOPIC    EKG EKG Interpretation  Date/Time:  Friday October 28 2021 13:44:54 EDT Ventricular Rate:  71 PR Interval:  208 QRS Duration: 167 QT Interval:  455 QTC Calculation: 495 R Axis:   260 Text Interpretation: Sinus rhythm RBBB and LAFB Confirmed by Alvester Chou [phone]) on 10/28/2021 1:54:56 PM  Radiology CT ANGIO HEAD NECK W WO CM (CODE STROKE)  Result Date: 10/28/2021 CLINICAL DATA:  No other LV 0 I can talk CT and these strokes or crazy EXAM: CT ANGIOGRAPHY HEAD AND NECK TECHNIQUE: Multidetector CT imaging of the head and neck was performed using the standard protocol during bolus administration of intravenous contrast. Multiplanar CT image reconstructions and MIPs were obtained to evaluate the vascular anatomy. Carotid stenosis measurements (when applicable) are obtained utilizing NASCET criteria, using the distal internal carotid diameter as the denominator. RADIATION DOSE REDUCTION: This exam was performed according to the departmental dose-optimization program which includes automated exposure control, adjustment of the mA and/or kV according to patient size and/or use of iterative reconstruction technique. CONTRAST:  71mL OMNIPAQUE IOHEXOL 350 MG/ML SOLN COMPARISON:  None Available. FINDINGS: CTA NECK FINDINGS Aortic arch: Great vessel origins are patent without significant stenosis. Right carotid system: Atherosclerosis at the carotid bifurcation without greater than 50% stenosis. Left carotid system: Atherosclerosis at the carotid  bifurcation without greater than 50% stenosis. Vertebral arteries: Vertebral artery origins are poorly visualized due to artifact. Probably moderate stenosis of the proximal right V2 vertebral artery. Otherwise, vertebral arteries are patent without significant (greater than 50 %) stenosis. Skeleton: Moderate multilevel degenerative change. Other neck: No acute findings. Upper chest: Visualized lung apices are clear. Review of the MIP images confirms the above findings CTA HEAD FINDINGS Anterior circulation: Significantly limited study due to delayed/venous contrast bolus timing. Within this limitation, there is poor visualization of the distal left M1 MCA, concerning for for severe stenosis versus occlusion. Additionally, there may be a severe stenosis or occlusion of the distal right M1 versus proximal M2 MCA. Both findings are difficult to confirm given limitations of the study. More distal MCA vessel evaluation is essentially  nondiagnostic on this study. The proximal ACAs appear to be grossly patent bilaterally. Posterior circulation: Calcific atherosclerosis of bilateral intradural vertebral arteries without significant stenosis. Basilar artery and bilateral posterior cerebral arteries appear grossly patent but the PCAs are poorly visualized due to technique. Limited assessment for stenosis. Venous sinuses: As permitted by contrast timing, patent. Review of the MIP images confirms the above findings IMPRESSION: 1. Significantly limited study due to delayed/venous contrast bolus timing. Within this limitation, there are findings that are suspicious for severe stenosis versus occlusion of the left M1 MCA. Additionally, there may be a severe stenosis or occlusion of the distal right M1 versus proximal M2 MCA. Both findings are difficult to confirm given the limitations of the study. A repeat CTA could be obtained to better characterize if clinically warranted. 2. Limited visualization of the proximal vertebral  arteries with potentially moderate stenosis bilaterally. Code stroke imaging results were communicated on 10/28/2021 at 3:06 pm to provider Wilford Corner via telephone, who verbally acknowledged these results. Electronically Signed   By: Feliberto Harts M.D.   On: 10/28/2021 15:16   CT HEAD CODE STROKE WO CONTRAST  Result Date: 10/28/2021 CLINICAL DATA:  Code stroke. Neuro deficit, acute, stroke suspected expressive aphasia EXAM: CT HEAD WITHOUT CONTRAST TECHNIQUE: Contiguous axial images were obtained from the base of the skull through the vertex without intravenous contrast. RADIATION DOSE REDUCTION: This exam was performed according to the departmental dose-optimization program which includes automated exposure control, adjustment of the mA and/or kV according to patient size and/or use of iterative reconstruction technique. COMPARISON:  CT head 02/15/2021 FINDINGS: Brain: Multiple infarcts appear similar to prior, including a perforator infarct in the right corona radiata as well as infarcts in the left frontal and parietal lobes. Also, similar small lacunar infarct in the left basal ganglia. Vascular: No hyperdense vessel identified. Skull: No acute fracture. Small metallic foreign body in the right scalp. Sinuses/Orbits: Clear sinuses. Other: No mastoid effusions. ASPECTS [facility] Stroke Program Early CT Score) total score (0-10 with 10 being normal): 10. IMPRESSION: 1. No evidence of acute large vascular territory infarct or acute hemorrhage. ASPECTS is 10. 2. Multiple prior infarcts appear similar to the prior. An MRI could provide more sensitive evaluation for acute peri-infarct ischemia if clinically warranted. Code stroke imaging results were communicated on 10/28/2021 at 2:24 pm to provider Dr. Renaye Rakers via telephone, who verbally acknowledged these results. Electronically Signed   By: Feliberto Harts M.D.   On: 10/28/2021 14:27    Procedures Procedures    Medications Ordered in ED Medications  iohexol  (OMNIPAQUE) 350 MG/ML injection 100 mL (60 mLs Intravenous Contrast Given 10/28/21 1442)    ED Course/ Medical Decision Making/ A&P Clinical Course as of 10/28/21 1522  Fri Oct 28, 2021  1354 Code stroke activated per VAN criteria with expressive aphasia; no hemineglect, difficulty with right and left hand finger-to-nose and ataxia [MT]  1416 Teleneurologist consulting with family now [MT]  1519 Dr Wilford Corner from neurology is recommending ED to ED transfer and neurology evaluation in [facility] ED - pt will need admission, but if there are any further neuro changes he may need repeat brain imaging at [facility] (not eligible for MRI due to "BB metal fragment" in head or skull, per neurologist). [MT]    Clinical Course User Index [MT] Patrese Neal, Kermit Balo, MD                           Medical Decision Making  Amount and/or Complexity of Data Reviewed Labs: ordered. Radiology: ordered.  Risk Prescription drug management.   Patient is here with expressive aphasia which may be recrudescence of old stroke versus acute infarct.  External records reviewed including [facility] records showing left frontal and parietal occipital infarct in 2022.    He has several stroke risk factors which carry high morbidity mortality risk, including hypertension and diabetes.  Supplemental history provided by family at bedside.  I personally ordered reviewed and interpreted patient's labs and imaging.  This was notable for no acute abnormalities in the patient's blood work.  CT imaging of the brain initially showing chronic infarct findings, unclear if there are acute findings in the setting of these chronic changes.  Patient is now pending CT angiogram.  Neurologist consulted and following up on images -final recommendations pending.   I personally viewed interpreted EKG which shows a sinus rhythm with bifascicular block pattern, no evident arrhythmia and no STEMI.  Neurologist Dr Wilford Corner is recommending stat ER to ER transfer  to [facility] for in person neurology evaluation, and anticipated medical admission; however Dr Wilford Corner requests that patient will need neurology evaluation in ED prior to admission.  If the patient deteriorates or is further worsening neurological deficits, he may require repeat imaging of the brain.  There is some concern about possible MCA occlusion on the CTA imaging, it is not clear if this is related to artifact or poorly timed study, or true stenosis.  The patient is not eligible for MRI of the brain due to "BB or metal fragment" reportedly in the head or skull.  Patient accepted by Dr Adela Lank at [facility] ED.  ED team to page neurology on patient's arrival.         Final Clinical Impression(s) / ED Diagnoses Final diagnoses:  Aphasia    Rx / DC Orders ED Discharge Orders     None         Amaziah Ghosh, Kermit Balo, MD 10/28/21 1524

## 2021-10-28 NOTE — Consult Note (Signed)
Please see full consult note from Dr. Jerrell Belfast earlier today for details of the patient's history.  In brief he was transferred from med [facility] to [facility] for CT perfusion imaging given fluctuating aphasia.  NIH stroke scale of 9 for unable to answer month and age correctly, intermittently following commands, drift in both the right and left leg, moderate aphasia, dysarthria  Patient did also have right upper extremity pronation without drift which did not score point on the NIH  CTA and CT perfusion personally reviewed, agree with radiology: 1. Emergent large vessel occlusion of the proximal left M1 segment with poor collaterals. 2. No other significant proximal stenosis, aneurysm, or branch vessel occlusion within the Circle of Willis. 3. Atherosclerotic changes at the carotid bifurcations bilaterally without significant stenosis of greater than 50%. 4. Moderate stenosis at the origin of the dominant left vertebral artery. 5. Moderate stenosis of the right vertebral artery at the dural margin and V4 segment. 6. Remote infarcts of the left frontal and parietal lobes are stable. 7. Remote white matter infarct of the right corona radiata is stable.  CT perfusion revealed 62 cc of tissue at risk with no core infarct identified  Unfortunately, due to an ongoing case, neuro interventional radiology was unable to accommodate patient at our facility.  [facility] facility was contacted and Dr. Lanna Poche accepted patient  CRITICAL CARE Performed by: Gordy Councilman   Total critical care time: 45 minutes  Critical care time was exclusive of separately billable procedures and treating other patients.  Critical care was necessary to treat or prevent imminent or life-threatening deterioration.  Critical care was time spent personally by me on the following activities: development of treatment plan with patient and/or surrogate as well as nursing, discussions with consultants,  evaluation of patient's response to treatment, examination of patient, obtaining history from patient or surrogate, ordering and performing treatments and interventions, ordering and review of laboratory studies, ordering and review of radiographic studies, pulse oximetry and re-evaluation of patient's condition.  Significant amount of time was spent to arrange safe transfer of patient

## 2021-10-28 NOTE — ED Notes (Signed)
Patient transported to CT 

## 2021-10-28 NOTE — Progress Notes (Signed)
Telestroke RN Note  1355: Code stroke activated via cart. Nurse & EDP at bedside assessing patient. LWK thought to be 1830.  1406: Patient taken to CT. Page sent to Dr. Wilford Corner.   1412: Patient back in room. Dr. Wilford Corner virtually present and assessing patient. LWK confirmed to be 1830 & MRS 0.   1425: Patient back to CT for advanced imaging. Signed off cart.   1535: EDP Dr. Renaye Rakers notified of CT head results & recommendations.

## 2021-10-28 NOTE — ED Notes (Signed)
Report given to [facility], ETA .
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
## 2021-10-28 NOTE — ED Triage Notes (Addendum)
Pt arrived via [facility] from [facility] for further evaluation of stroke like symptoms. At time of arrival, pt demonstrating expressive and receptive aphasia, right sided weakness, difficulty following complex commands, A&Ox2 (alert to self and place). Neuro paged. 18g RAC.  PTA Vitals  BP 143/84 HR 63 RR 14 SPO2 100%

## 2021-10-28 NOTE — ED Notes (Signed)
Patient transported to MRI with RN 

## 2021-10-28 NOTE — ED Provider Notes (Signed)
66 year old male with a chief complaints of aphasia.  Arrived at one of our systems emergency departments and was outside of the window for thrombolytics but LVO positive and so a CT angiogram was ordered.  Concern for possible occlusion.  Case was discussed with neurology and plan to send him here for acute neurology evaluation.  On arrival here patient having trouble with word finding.  Denies any unilateral weakness denies difficulty swallowing.  Neurology notified of arrival to the ED.  Neuro concerned for LVO, transferring to [facility].   CRITICAL CARE Performed by: Rae Roam   Total critical care time: 35 minutes  Critical care time was exclusive of separately billable procedures and treating other patients.  Critical care was necessary to treat or prevent imminent or life-threatening deterioration.  Critical care was time spent personally by me on the following activities: development of treatment plan with patient and/or surrogate as well as nursing, discussions with consultants, evaluation of patient's response to treatment, examination of patient, obtaining history from patient or surrogate, ordering and performing treatments and interventions, ordering and review of laboratory studies, ordering and review of radiographic studies, pulse oximetry and re-evaluation of patient's condition.    Melene Plan, DO 10/28/21 2256

## 2021-10-28 NOTE — ED Triage Notes (Signed)
Patient c/o slurred speech x last night at 0130AM LKWT: yesterday at 1830. Per family his last stroke he did the same.   Code 24 activated

## 2022-01-23 ENCOUNTER — Emergency Department (HOSPITAL_COMMUNITY): Payer: BLUE CROSS/BLUE SHIELD

## 2022-01-23 ENCOUNTER — Emergency Department (HOSPITAL_BASED_OUTPATIENT_CLINIC_OR_DEPARTMENT_OTHER): Payer: BLUE CROSS/BLUE SHIELD

## 2022-01-23 ENCOUNTER — Other Ambulatory Visit: Payer: Self-pay

## 2022-01-23 ENCOUNTER — Encounter (HOSPITAL_BASED_OUTPATIENT_CLINIC_OR_DEPARTMENT_OTHER): Payer: Self-pay | Admitting: Emergency Medicine

## 2022-01-23 ENCOUNTER — Emergency Department (HOSPITAL_BASED_OUTPATIENT_CLINIC_OR_DEPARTMENT_OTHER)
Admission: EM | Admit: 2022-01-23 | Discharge: 2022-01-23 | Disposition: A | Discharge status: Home / Self Care | Payer: BLUE CROSS/BLUE SHIELD | Attending: Emergency Medicine | Admitting: Emergency Medicine

## 2022-01-23 DIAGNOSIS — R42 Dizziness and giddiness: Secondary | ICD-10-CM | POA: Diagnosis present

## 2022-01-23 DIAGNOSIS — I10 Essential (primary) hypertension: Secondary | ICD-10-CM | POA: Insufficient documentation

## 2022-01-23 DIAGNOSIS — Z79899 Other long term (current) drug therapy: Secondary | ICD-10-CM | POA: Insufficient documentation

## 2022-01-23 DIAGNOSIS — Z7982 Long term (current) use of aspirin: Secondary | ICD-10-CM | POA: Insufficient documentation

## 2022-01-23 DIAGNOSIS — Z7984 Long term (current) use of oral hypoglycemic drugs: Secondary | ICD-10-CM | POA: Insufficient documentation

## 2022-01-23 DIAGNOSIS — E119 Type 2 diabetes mellitus without complications: Secondary | ICD-10-CM | POA: Insufficient documentation

## 2022-01-23 LAB — COMPREHENSIVE METABOLIC PANEL
ALT: 19 U/L (ref 0–44)
AST: 20 U/L (ref 15–41)
Albumin: 4.2 g/dL (ref 3.5–5.0)
Alkaline Phosphatase: 89 U/L (ref 38–126)
Anion gap: 12 (ref 5–15)
BUN: 17 mg/dL (ref 8–23)
CO2: 24 mmol/L (ref 22–32)
Calcium: 9.4 mg/dL (ref 8.9–10.3)
Chloride: 102 mmol/L (ref 98–111)
Creatinine, Ser: 1.17 mg/dL (ref 0.61–1.24)
GFR, Estimated: >60 mL/min (ref >60)
Glucose, Bld: 213 mg/dL — ABNORMAL HIGH (ref 70–99)
Potassium: 3.1 mmol/L — ABNORMAL LOW (ref 3.5–5.1)
Sodium: 138 mmol/L (ref 135–145)
Total Bilirubin: 1 mg/dL (ref 0.3–1.2)
Total Protein: 7.4 g/dL (ref 6.5–8.1)

## 2022-01-23 LAB — DIFFERENTIAL
Abs Immature Granulocytes: 0.01 10*3/uL (ref 0.00–0.07)
Basophils Absolute: 0.1 10*3/uL (ref 0.0–0.1)
Basophils Relative: 1 %
Eosinophils Absolute: 0 10*3/uL (ref 0.0–0.5)
Eosinophils Relative: 0 %
Immature Granulocytes: 0 %
Lymphocytes Relative: 16 %
Lymphs Abs: 0.9 10*3/uL (ref 0.7–4.0)
Monocytes Absolute: 0.2 10*3/uL (ref 0.1–1.0)
Monocytes Relative: 3 %
Neutro Abs: 4.5 10*3/uL (ref 1.7–7.7)
Neutrophils Relative %: 80 %

## 2022-01-23 LAB — CBC
HCT: 39.5 % (ref 39.0–52.0)
Hemoglobin: 12.9 g/dL — ABNORMAL LOW (ref 13.0–17.0)
MCH: 25.6 pg — ABNORMAL LOW (ref 26.0–34.0)
MCHC: 32.7 g/dL (ref 30.0–36.0)
MCV: 78.4 fL — ABNORMAL LOW (ref 80.0–100.0)
Platelets: 285 10*3/uL (ref 150–400)
RBC: 5.04 MIL/uL (ref 4.22–5.81)
RDW: 15.5 % (ref 11.5–15.5)
WBC: 5.7 10*3/uL (ref 4.0–10.5)
nRBC: 0 % (ref 0.0–0.2)

## 2022-01-23 LAB — CBG MONITORING, ED: Glucose-Capillary: 228 mg/dL — ABNORMAL HIGH (ref 70–99)

## 2022-01-23 LAB — PROTIME-INR
INR: 1.1 (ref 0.8–1.2)
Prothrombin Time: 14.1 seconds (ref 11.4–15.2)

## 2022-01-23 LAB — APTT: aPTT: 24 seconds (ref 24–36)

## 2022-01-23 LAB — ETHANOL: Alcohol, Ethyl (B): <10 mg/dL (ref <10)

## 2022-01-23 MED ORDER — SODIUM CHLORIDE 0.9 % IV SOLN
12.5000 mg | Freq: Once | INTRAVENOUS | Status: AC
  Administered 2022-01-23: 12.5 mg via INTRAVENOUS
  Filled 2022-01-23: qty 0.5

## 2022-01-23 MED ORDER — MECLIZINE HCL 25 MG PO TABS: 25.0000 mg | ORAL_TABLET | Freq: Three times a day (TID) | ORAL | 0 refills | Status: AC

## 2022-01-23 MED ORDER — LORAZEPAM 2 MG/ML IJ SOLN
1.0000 mg | Freq: Once | INTRAMUSCULAR | Status: AC
  Administered 2022-01-23: 1 mg via INTRAVENOUS
  Filled 2022-01-23: qty 1

## 2022-01-23 MED ORDER — ONDANSETRON HCL 4 MG/2ML IJ SOLN
4.0000 mg | Freq: Once | INTRAMUSCULAR | Status: AC
  Administered 2022-01-23: 4 mg via INTRAVENOUS
  Filled 2022-01-23: qty 2

## 2022-01-23 MED ORDER — MECLIZINE HCL 25 MG PO TABS: 50.0000 mg | ORAL_TABLET | Freq: Once | ORAL | Status: DC

## 2022-01-23 MED ORDER — METOCLOPRAMIDE HCL 5 MG/ML IJ SOLN: 10.0000 mg | Freq: Once | INTRAMUSCULAR | Status: DC

## 2022-01-23 NOTE — ED Notes (Signed)
Patient transported to MRI 

## 2022-01-23 NOTE — ED Notes (Signed)
Patient transported to CT 

## 2022-01-23 NOTE — ED Notes (Signed)
Patient reports being nauseous still. EDP aware for alternative choice.

## 2022-01-23 NOTE — ED Notes (Signed)
Report given to Jamie, Charge RN 

## 2022-01-23 NOTE — ED Provider Notes (Signed)
6:43 PM Patient seen on arrival after notes for from our affiliated facility for MRI.  Patient notes he continues to have dizziness.  Patient can move his head laterally without acute onset nausea, vomiting, however.  Additional meds pending, MRI pending.  10:39 PM Patient calm, no ongoing complaints.  I reviewed the MRI with him and his brother.  History of prior stroke, no evidence for acute stroke.  Patient comfortable with discharge and outpatient follow-up.   Gerhard Munch, MD 01/23/22 2240

## 2022-01-23 NOTE — ED Provider Notes (Signed)
[name] EMERGENCY DEPARTMENT Provider Note   CSN: [id number] Arrival date & time: 01/23/22  1042     History  Chief Complaint  Patient presents with   Dizziness    Juan Kelly is a 66 y.o. male.  HPI    66 year old male comes in with chief complaint of dizziness/vertigo. Patient has history of hypertension, hyperlipidemia, diabetes and LVO stroke in September with hemorrhagic conversion. Patient was last known normal at 10 PM.  He states that he woke up this morning feeling dizzy.  Dizziness described as spinning sensation that is constant, even at rest.  Symptoms are worse with him moving.  He has associated nausea and vomiting.  Review of system is negative for any double vision, slurred speech, difficulty in swallowing, one-sided weakness or numbness.   Home Medications Prior to Admission medications   Medication Sig Start Date End Date Taking? Authorizing Provider  amLODipine (NORVASC) 10 MG tablet Take 1 tablet (10 mg total) by mouth daily. 11/13/19   Matilde Haymaker, MD  aspirin 81 MG tablet Take 1 tablet (81 mg total) by mouth daily. 11/09/14   Vivi Barrack, MD  canagliflozin [facility]) 300 MG TABS tablet Take 1 tablet (300 mg total) by mouth daily before breakfast. 08/14/17   Diallo, Earna Coder, MD  hydrochlorothiazide (HYDRODIURIL) 25 MG tablet Take 1 tablet (25 mg total) by mouth daily. 11/13/19   Matilde Haymaker, MD  lisinopril (ZESTRIL) 40 MG tablet Take 1 tablet (40 mg total) by mouth daily. 11/17/19   Matilde Haymaker, MD  metFORMIN (GLUCOPHAGE) 1000 MG tablet Take 1 tablet (1,000 mg total) by mouth 2 (two) times daily with a meal. 10/06/19   Matilde Haymaker, MD  metoprolol tartrate (LOPRESSOR) 100 MG tablet Take 1 tablet (100 mg total) by mouth 2 (two) times daily. 11/13/19   Matilde Haymaker, MD  Multiple Vitamin (MULTIVITAMIN) capsule Take 1 capsule by mouth daily.      [provider]  niacin 500 MG CR capsule Take 1 capsule (500 mg total) by mouth at bedtime.  08/07/16   Vivi Barrack, MD  Omega-3 Fatty Acids (FISH OIL PO) Take by mouth. 3 daily.     [provider]  rosuvastatin (CRESTOR) 20 MG tablet Take 1 tablet (20 mg total) by mouth daily. 11/17/19   Matilde Haymaker, MD      Allergies    Patient has no known allergies.    Review of Systems   Review of Systems  All other systems reviewed and are negative.   Physical Exam Updated Vital Signs BP (!) 175/78 (BP Location: Right Arm)   Pulse 79   Temp (!) 97.5 F (36.4 C) (Oral)   Resp 14   Ht 5\' 9"  (1.753 m)   Wt 99.8 kg   SpO2 100%   BMI 32.49 kg/m  Physical Exam Vitals and nursing note reviewed.  Constitutional:      Appearance: He is well-developed.  HENT:     Head: Atraumatic.  Eyes:     Pupils: Pupils are equal, round, and reactive to light.     Comments: Patient has bidirectional nystagmus, but he also has corrective gaze on head impulse test.   Cardiovascular:     Rate and Rhythm: Normal rate.  Pulmonary:     Effort: Pulmonary effort is normal.  Musculoskeletal:     Cervical back: Neck supple.  Skin:    General: Skin is warm.  Neurological:     Mental Status: He is alert and oriented to  person, place, and time.     Cranial Nerves: No cranial nerve deficit.     Sensory: No sensory deficit.     Motor: No weakness.     Coordination: Coordination normal.     Comments: Finger-to-nose reveals no clear dysmetria although patient had to work harder than expected.     ED Results / Procedures / Treatments   Labs (all labs ordered are listed, but only abnormal results are displayed) Labs Reviewed  CBC - Abnormal; Notable for the following components:      Result Value   Hemoglobin 12.9 (*)    MCV 78.4 (*)    MCH 25.6 (*)    All other components within normal limits  COMPREHENSIVE METABOLIC PANEL - Abnormal; Notable for the following components:   Potassium 3.1 (*)    Glucose, Bld 213 (*)    All other components within normal limits  CBG MONITORING, ED -  Abnormal; Notable for the following components:   Glucose-Capillary 228 (*)    All other components within normal limits  PROTIME-INR  APTT  DIFFERENTIAL  ETHANOL  URINALYSIS, ROUTINE W REFLEX MICROSCOPIC    EKG EKG Interpretation  Date/Time:  Monday January 23 2022 12:08:42 EST Ventricular Rate:  79 PR Interval:  213 QRS Duration: 170 QT Interval:  441 QTC Calculation: 506 R Axis:   -84 Text Interpretation: Sinus rhythm Ventricular premature complex Borderline prolonged PR interval RBBB and LAFB No acute changes TWI in inferior and lateral leads are new Confirmed by Varney Biles [phone]) on 01/23/2022 12:31:08 PM  Radiology CT HEAD WO CONTRAST  Result Date: 01/23/2022 CLINICAL DATA:  Dizzy anus.  Presyncope. EXAM: CT HEAD WITHOUT CONTRAST TECHNIQUE: Contiguous axial images were obtained from the base of the skull through the vertex without intravenous contrast. RADIATION DOSE REDUCTION: This exam was performed according to the departmental dose-optimization program which includes automated exposure control, adjustment of the mA and/or kV according to patient size and/or use of iterative reconstruction technique. COMPARISON:  12/04/2021 FINDINGS: Brain: There is no evidence for acute hemorrhage, hydrocephalus, mass lesion, or abnormal extra-axial fluid collection. No definite CT evidence for acute infarction. Patchy low attenuation in the deep hemispheric and periventricular white matter is nonspecific, but likely reflects chronic microvascular ischemic demyelination. Encephalomalacia in the posterior left frontal region and left parietal lobe is stable in the interval, likely post ischemic. Vascular: No hyperdense vessel or unexpected calcification. Skull: No evidence for fracture. No worrisome lytic or sclerotic lesion. Portions of the right skull have not been included in the field of view. Sinuses/Orbits: The visualized paranasal sinuses and mastoid air cells are clear. Visualized  portions of the globes and intraorbital fat are unremarkable. Other: None. IMPRESSION: 1. No acute intracranial abnormality. 2. Stable encephalomalacia in the posterior left frontal region and left parietal lobe, likely post ischemic. 3. Chronic small vessel ischemic disease. Electronically Signed   By: Misty Stanley M.D.   On: 01/23/2022 12:31    Procedures Procedures    Medications Ordered in ED Medications  meclizine (ANTIVERT) tablet 50 mg (has no administration in time range)  ondansetron (ZOFRAN) injection 4 mg (has no administration in time range)  ondansetron (ZOFRAN) injection 4 mg (4 mg Intravenous Given 01/23/22 1227)  LORazepam (ATIVAN) injection 1 mg (1 mg Intravenous Given 01/23/22 1228)    ED Course/ Medical Decision Making/ A&P  Medical Decision Making This patient presents to the ED with chief complaint(s) of dizziness with associated nausea and vomiting, last known normal 10 PM with pertinent past medical history of left MCA stroke in September, diabetes.The complaint involves an extensive differential diagnosis and also carries with it a high risk of complications and morbidity.    The differential diagnosis includes  Central vertigo:  Tumor  Stroke  ICH  Vertebrobasilar TIA  Peripheral Vertigo:  BPPV  Vestibular neuritis  Meniere disease  Migrainous vertigo  Ear Infection   Patient demonstrates features of both central and peripheral vertigo, but he has rotary nystagmus that is concerning on exam. Therefore, patient will get CT scan of the brain.  If negative he will be transferred to [facility] for [facility].  The initial plan is to give patient IV Ativan and Antivert while he is awaiting workup.  CT scan of the brain also ordered to ensure there is no brain bleed.  Patient had hemorrhagic conversion after his MCA stroke in September.   Additional history obtained: Additional history obtained from family Records reviewed previous  admission documents and Care Everywhere/External Records  Independent labs interpretation:  The following labs were independently interpreted: CBC is reassuring.  No severe anemia, no severe electrolyte abnormality that is concerning.  Independent visualization and interpretation of imaging: - I independently visualized the following imaging with scope of interpretation limited to determining acute life threatening conditions related to emergency care: CT scan of the brain, which revealed no evidence of acute brain bleed.  Treatment and Reassessment: Patient will be transferred to [facility], ED for MRI brain. Dr. Virgina Norfolk made aware at [facility].  Patient made aware of the plan.  If stroke is positive, he will need admission.  If stroke negative then we will try to manage his symptoms better.  Problems Addressed: Vertigo: undiagnosed new problem with uncertain prognosis    Details: Needs to rule out stroke  Amount and/or Complexity of Data Reviewed Labs: ordered. Radiology: ordered.  Risk Prescription drug management.    Final Clinical Impression(s) / ED Diagnoses Final diagnoses:  Vertigo    Rx / DC Orders ED Discharge Orders     None         Derwood Kaplan, MD 01/23/22 1246

## 2022-01-23 NOTE — ED Triage Notes (Signed)
Pt arrives via [facility] from [facility] for MRI. Pt still having dizziness.

## 2022-01-23 NOTE — ED Notes (Signed)
Note sent to MD+RN. Patient is too sleepy for the visual acuity test.

## 2022-01-23 NOTE — ED Notes (Signed)
MRI made aware pt is here from [facility]

## 2022-01-23 NOTE — Discharge Instructions (Signed)
As discussed, denies MRI has been generally reassuring.  It is important that you follow-up with your primary care physician.  Do not hesitate to return here for concerning changes.

## 2022-01-23 NOTE — ED Triage Notes (Addendum)
Pt started having some dizziness/room spinning since 8 am.  Last known normal 10 pm last night.   Pt states he started having vomiting with dizziness after breakfast.  Pt denies any weakness.  Pt had stroke about 2 months ago.
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
## 2022-08-17 ENCOUNTER — Encounter: Payer: Self-pay | Admitting: Neurology

## 2022-08-17 ENCOUNTER — Ambulatory Visit (INDEPENDENT_AMBULATORY_CARE_PROVIDER_SITE_OTHER): Payer: Medicare HMO | Admitting: Neurology

## 2022-08-17 VITALS — BP 138/78 | HR 60 | Ht 69.0 in | Wt 212.0 lb

## 2022-08-17 DIAGNOSIS — I69320 Aphasia following cerebral infarction: Secondary | ICD-10-CM

## 2022-08-17 DIAGNOSIS — I63512 Cerebral infarction due to unspecified occlusion or stenosis of left middle cerebral artery: Secondary | ICD-10-CM | POA: Diagnosis not present

## 2022-08-17 DIAGNOSIS — Z9189 Other specified personal risk factors, not elsewhere classified: Secondary | ICD-10-CM | POA: Diagnosis not present

## 2022-08-17 NOTE — Progress Notes (Signed)
[facility] [address] [place]. [place] [address] [phone]       OFFICE CONSULT NOTE  Juan Kelly Date of Birth:  07-10-1955 Medical Record Number:  [id number]   Referring MD: Abran Duke  Reason for Referral: Stroke  HPI: Juan Kelly is a pleasant 67 year old male seen today for initial office consultation visit for stroke.  History is obtained from the patient and review of electronic medical records in epic as well as care everywhere and I personally reviewed the available pertinent imaging films and.  I have personally reviewed pertinent available films in PACS.  He has past medical history of diabetes, hypertension, hyperlipidemia and myocardial infarction.  He presented on 10/28/2021 to [facility] ER for evaluation for slurred speech confusion.  He was found to have stroke scale of 9 with aphasia and right hemiparesis.  CT angiogram showed left M1 occlusion.  CT perfusion showed 0 ccc core  and 62 cc of penumbra.  The patient unfortunately could not be taken to [facility] since there was already a patient on the table hence was emergently transferred to [facility] for mechanical thrombectomy which was performed successfully.  He stayed there for 7 days and MRI scan of the brain showed left basal ganglia and corona radiata infarct with small hemorrhage in the left external capsule.  Echocardiogram showed ejection fraction of 65 to 70%.  LDL cholesterol 51 mg percent.  Hemoglobin A1c was 8.0. He was discharged on aspirin and Plavix for 3 months and is currently on aspirin alone.  Patient states he is doing quite well.  He is almost back to normal.  His speech is slow and hesitant that he can carry conversation quite well.  He had mild right-sided weakness but is able to walk independently without assistance.  At home with his brother.  He is independent in all activities of daily living.  He wants to drive.  He is currently doing outpatient speech  therapy.  Follow-up lab work/13/24 showed hemoglobin A1c proved to 6.4 and LDL cholesterol 76 mg.  He has no new complaints.  ROS:   14 system review of systems is positive for aphasia, speech difficulty, with he can walk independently without any help., bruising and all other systems negative  PMH:  Past Medical History:  Diagnosis Date   Diabetes mellitus    Hyperlipidemia    Hypertension    Myocardial infarction [facility])    Did have elevated cardiac enzymes but normal cath   Stroke (cerebrum) [facility])     Social History:  Social History   Socioeconomic History   Marital status: Single    Spouse name: Not on file   Number of children: Not on file   Years of education: Not on file   Highest education level: Not on file  Occupational History   Not on file  Tobacco Use   Smoking status: Never   Smokeless tobacco: Never  Substance and Sexual Activity   Alcohol use: No    Alcohol/week: 0.0 standard drinks of alcohol   Drug use: No   Sexual activity: Yes  Other Topics Concern   Not on file  Social History Narrative   Not on file   Social Determinants of Health   Financial Resource Strain: Not on file  Food Insecurity: Not on file  Transportation Needs: Not on file  Physical Activity: Not on file  Stress: Not on file  Social Connections: Not on file  Intimate Partner Violence: Not on file  Medications:   Current Outpatient Medications on File Prior to Visit  Medication Sig Dispense Refill   amLODipine (NORVASC) 10 MG tablet Take 1 tablet (10 mg total) by mouth daily. 30 tablet 0   aspirin 81 MG tablet Take 1 tablet (81 mg total) by mouth daily. 90 tablet 3   atorvastatin (LIPITOR) 80 MG tablet Take 80 mg by mouth daily.     escitalopram (LEXAPRO) 5 MG tablet Take 5 mg by mouth daily.     losartan (COZAAR) 50 MG tablet Take 50 mg by mouth daily.     metoprolol tartrate (LOPRESSOR) 100 MG tablet Take 1 tablet (100 mg total) by mouth 2 (two) times daily. 30 tablet 0    Multiple Vitamin (MULTIVITAMIN) capsule Take 1 capsule by mouth daily.       PRESCRIPTION MEDICATION Cholesterol mediation, does not recall name     Omega-3 Fatty Acids (FISH OIL PO) Take by mouth. 3 daily.  (Patient not taking: Reported on 08/17/2022)     No current facility-administered medications on file prior to visit.    Allergies:  No Known Allergies  Physical Exam General: well developed, well nourished mildly obese middle-aged African-American male, seated, in no evident distress Head: head normocephalic and atraumatic.   Neck: supple with no carotid or supraclavicular bruits Cardiovascular: regular rate and rhythm, no murmurs Musculoskeletal: no deformity Skin:  no rash/petichiae Vascular:  Normal pulses all extremities  Neurologic Exam Mental Status: Awake and fully alert. Oriented to place and time. Recent and remote memory intact. Attention span, concentration and fund of knowledge appropriate. Mood and affect appropriate.  Mild nonfluent speech with occasional word finding difficulties.  No paraphasic errors.  Good naming, repetition and comprehension. Cranial Nerves: Fundoscopic exam reveals sharp disc margins. Pupils equal, briskly reactive to light. Extraocular movements full without nystagmus. Visual fields full to confrontation. Hearing intact. Facial sensation intact. Face, tongue, palate moves normally and symmetrically.  Motor: Normal bulk and tone. Normal strength in all tested extremity muscles.  Diminished fine finger movements on the right.  Orbits left or right upper extremity. Sensory.: intact to touch , pinprick , position and vibratory sensation.  Coordination: Rapid alternating movements normal in all extremities. Finger-to-nose and heel-to-shin performed accurately bilaterally. Gait and Station: Arises from chair without difficulty. Stance is normal. Gait demonstrates normal stride length and balance . Able to heel, toe and tandem walk with mild difficulty.   Reflexes: 1+ and symmetric. Toes downgoing.   NIHSS  1 Modified Rankin  2   ASSESSMENT: 67 year old African-American male with MCA infarct September 2023 from left M1 occlusion treated with successful mechanical thrombectomy doing well with only mild residual expressive aphasia and right disease.  Vascular risk factors of diabetes, hypertension, hyperlipidemia and intracranial stenosis     PLAN: I had a long d/w patient about his recent stroke, mechanical thrombectomy, residual mild aphasia and right-sided weakness risk for recurrent stroke/TIAs, personally independently reviewed imaging studies and stroke evaluation results and answered questions.Continue aspirin 81 mg daily  for secondary stroke prevention and maintain strict control of hypertension with blood pressure goal below 130/90, diabetes with hemoglobin A1c goal below 6.5% and lipids with LDL cholesterol goal below 70 mg/dL. I also advised the patient to eat a healthy diet with plenty of whole grains, cereals, fruits and vegetables, exercise regularly and maintain ideal body weight .check polysomnogram for sleep apnea.  Continue ongoing physical and speech therapy.  Followup in the future with my nurse practitioner months or call earlier if  necessary greater than 50% time during this 45-minute consultation visit was spent on counseling and coordination of care about her stroke and residual aphasia and weakness and answering questions Delia Heady, MD   Note: This document was prepared with digital dictation and possible smart phrase technology. Any transcriptional errors that result from this process are unintentional.

## 2022-08-17 NOTE — Patient Instructions (Signed)
I had a long d/w patient about his recent stroke, mechanical thrombectomy, residual mild aphasia and right-sided weakness risk for recurrent stroke/TIAs, personally independently reviewed imaging studies and stroke evaluation results and answered questions.Continue aspirin 81 mg daily  for secondary stroke prevention and maintain strict control of hypertension with blood pressure goal below 130/90, diabetes with hemoglobin A1c goal below 6.5% and lipids with LDL cholesterol goal below 70 mg/dL. I also advised the patient to eat a healthy diet with plenty of whole grains, cereals, fruits and vegetables, exercise regularly and maintain ideal body weight .check polysomnogram for sleep apnea.  New ongoing physical and speech therapy.  Followup in the future with my nurse practitioner months or call earlier if necessary  Stroke Prevention Some medical conditions and behaviors can lead to a higher chance of having a stroke. You can help prevent a stroke by eating healthy, exercising, not smoking, and managing any medical conditions you have. Stroke is a leading cause of functional impairment. Primary prevention is particularly important because a majority of strokes are first-time events. Stroke changes the lives of not only those who experience a stroke but also their family and other caregivers. How can this condition affect me? A stroke is a medical emergency and should be treated right away. A stroke can lead to brain damage and can sometimes be life-threatening. If a person gets medical treatment right away, there is a better chance of surviving and recovering from a stroke. What can increase my risk? The following medical conditions may increase your risk of a stroke: Cardiovascular disease. High blood pressure (hypertension). Diabetes. High cholesterol. Sickle cell disease. Blood clotting disorders (hypercoagulable state). Obesity. Sleep disorders (obstructive sleep apnea). Other risk factors  include: Being older than age 73. Having a history of blood clots, stroke, or mini-stroke (transient ischemic attack, TIA). Genetic factors, such as race, ethnicity, or a family history of stroke. Smoking cigarettes or using other tobacco products. Taking birth control pills, especially if you also use tobacco. Heavy use of alcohol or drugs, especially cocaine and methamphetamine. Physical inactivity. What actions can I take to prevent this? Manage your health conditions High cholesterol levels. Eating a healthy diet is important for preventing high cholesterol. If cholesterol cannot be managed through diet alone, you may need to take medicines. Take any prescribed medicines to control your cholesterol as told by your health care provider. Hypertension. To reduce your risk of stroke, try to keep your blood pressure below 130/80. Eating a healthy diet and exercising regularly are important for controlling blood pressure. If these steps are not enough to manage your blood pressure, you may need to take medicines. Take any prescribed medicines to control hypertension as told by your health care provider. Ask your health care provider if you should monitor your blood pressure at home. Have your blood pressure checked every year, even if your blood pressure is normal. Blood pressure increases with age and some medical conditions. Diabetes. Eating a healthy diet and exercising regularly are important parts of managing your blood sugar (glucose). If your blood sugar cannot be managed through diet and exercise, you may need to take medicines. Take any prescribed medicines to control your diabetes as told by your health care provider. Get evaluated for obstructive sleep apnea. Talk to your health care provider about getting a sleep evaluation if you snore a lot or have excessive sleepiness. Make sure that any other medical conditions you have, such as atrial fibrillation or atherosclerosis, are  managed. Nutrition Follow instructions  from your health care provider about what to eat or drink to help manage your health condition. These instructions may include: Reducing your daily calorie intake. Limiting how much salt (sodium) you use to 1,500 milligrams (mg) each day. Using only healthy fats for cooking, such as olive oil, canola oil, or sunflower oil. Eating healthy foods. You can do this by: Choosing foods that are high in fiber, such as whole grains, and fresh fruits and vegetables. Eating at least 5 servings of fruits and vegetables a day. Try to fill one-half of your plate with fruits and vegetables at each meal. Choosing lean protein foods, such as lean cuts of meat, poultry without skin, fish, tofu, beans, and nuts. Eating low-fat dairy products. Avoiding foods that are high in sodium. This can help lower blood pressure. Avoiding foods that have saturated fat, trans fat, and cholesterol. This can help prevent high cholesterol. Avoiding processed and prepared foods. Counting your daily carbohydrate intake.  Lifestyle If you drink alcohol: Limit how much you have to: 0-1 drink a day for women who are not pregnant. 0-2 drinks a day for men. Know how much alcohol is in your drink. In the [place] one drink equals one 12 oz bottle of beer ( ), one 5 oz glass of wine ( ), or one 1 oz glass of hard liquor (44mL). Do not use any products that contain nicotine or tobacco. These products include cigarettes, chewing tobacco, and vaping devices, such as e-cigarettes. If you need help quitting, ask your health care provider. Avoid secondhand smoke. Do not use drugs. Activity  Try to stay at a healthy weight. Get at least 30 minutes of exercise on most days, such as: Fast walking. Biking. Swimming. Medicines Take over-the-counter and prescription medicines only as told by your health care provider. Aspirin or blood thinners (antiplatelets or anticoagulants) may be recommended  to reduce your risk of forming blood clots that can lead to stroke. Avoid taking birth control pills. Talk to your health care provider about the risks of taking birth control pills if: You are over 73 years old. You smoke. You get very bad headaches. You have had a blood clot. Where to find more information American Stroke Association: www.strokeassociation.org Get help right away if: You or a loved one has any symptoms of a stroke. "BE FAST" is an easy way to remember the main warning signs of a stroke: B - Balance. Signs are dizziness, sudden trouble walking, or loss of balance. E - Eyes. Signs are trouble seeing or a sudden change in vision. F - Face. Signs are sudden weakness or numbness of the face, or the face or eyelid drooping on one side. A - Arms. Signs are weakness or numbness in an arm. This happens suddenly and usually on one side of the body. S - Speech. Signs are sudden trouble speaking, slurred speech, or trouble understanding what people say. T - Time. Time to call emergency services. Write down what time symptoms started. You or a loved one has other signs of a stroke, such as: A sudden, severe headache with no known cause. Nausea or vomiting. Seizure. These symptoms may represent a serious problem that is an emergency. Do not wait to see if the symptoms will go away. Get medical help right away. Call your local emergency services (911 in the [place] Do not drive yourself to the hospital. Summary You can help to prevent a stroke by eating healthy, exercising, not smoking, limiting alcohol intake, and managing any medical conditions you  may have. Do not use any products that contain nicotine or tobacco. These include cigarettes, chewing tobacco, and vaping devices, such as e-cigarettes. If you need help quitting, ask your health care provider. Remember "BE FAST" for warning signs of a stroke. Get help right away if you or a loved one has any of these signs. This information  is not intended to replace advice given to you by your health care provider. Make sure you discuss any questions you have with your health care provider. Document Revised: 01/09/2022 Document Reviewed: 01/09/2022 Elsevier Patient Education  2024 [employer].

## 2022-10-12 ENCOUNTER — Encounter: Payer: Self-pay | Admitting: Neurology

## 2022-10-12 ENCOUNTER — Ambulatory Visit (INDEPENDENT_AMBULATORY_CARE_PROVIDER_SITE_OTHER): Payer: Medicare HMO | Admitting: Neurology

## 2022-10-12 VITALS — BP 139/80 | HR 60 | Ht 69.0 in | Wt 223.0 lb

## 2022-10-12 DIAGNOSIS — Z820 Family history of epilepsy and other diseases of the nervous system: Secondary | ICD-10-CM

## 2022-10-12 DIAGNOSIS — R351 Nocturia: Secondary | ICD-10-CM

## 2022-10-12 DIAGNOSIS — E669 Obesity, unspecified: Secondary | ICD-10-CM | POA: Diagnosis not present

## 2022-10-12 DIAGNOSIS — Z823 Family history of stroke: Secondary | ICD-10-CM

## 2022-10-12 DIAGNOSIS — Z9189 Other specified personal risk factors, not elsewhere classified: Secondary | ICD-10-CM

## 2022-10-12 DIAGNOSIS — Z8673 Personal history of transient ischemic attack (TIA), and cerebral infarction without residual deficits: Secondary | ICD-10-CM

## 2022-10-12 NOTE — Progress Notes (Signed)
Subjective:    Patient ID: Juan Kelly is a 67 y.o. male.  HPI    Huston Foley, MD, PhD [facility] [address], [address] [address] [place], [place] [address]  Dear [name],   I saw your patient, Jazziel Kelly, upon your kind request in my sleep clinic today for initial consultation of his sleep disorder, in particular, concern for underlying obstructive sleep apnea.  The patient is unaccompanied today.  As you know, Juan Kelly is a 67 year old male with an underlying medical history of left MCA stroke in September 2023 with status post thrombectomy, left M1 occlusion, diabetes, hypertension, hyperlipidemia, history of MI, and mild obesity, who reports sleep disruption from nocturia.  His Epworth sleepiness score is 0 out of 24, fatigue severity score is 9 out of 63.  He reports a bedtime of midnight and rise time around 7.  He is single and lives with his brother.  He is currently not working but worked in [employer] before.  He drinks no caffeine on a day-to-day basis, usually decaf coffee about 1 to 2 cups in the morning.  He tries to hydrate well with water.  He does not currently drink any alcohol.  He is a non-smoker.  He is working on weight loss.  He has been more or less stable with his weight.  His sister had a stroke, he has a brother with sleep apnea.  His Past Medical History Is Significant For: Past Medical History:  Diagnosis Date   Diabetes mellitus    Hyperlipidemia    Hypertension    Myocardial infarction [facility])    Did have elevated cardiac enzymes but normal cath   Stroke (cerebrum) [facility])     His Past Surgical History Is Significant For: History reviewed. No pertinent surgical history.  His Family History Is Significant For: Family History  Problem Relation Age of Onset   Diabetes Mother    Stroke Sister    Sleep apnea Brother     His Social History Is Significant For: Social History   Socioeconomic History   Marital status: Single     Spouse name: Not on file   Number of children: Not on file   Years of education: Not on file   Highest education level: Not on file  Occupational History   Not on file  Tobacco Use   Smoking status: Never   Smokeless tobacco: Never  Substance and Sexual Activity   Alcohol use: No    Alcohol/week: 0.0 standard drinks of alcohol   Drug use: No   Sexual activity: Yes  Other Topics Concern   Not on file  Social History Narrative   Not on file   Social Determinants of Health   Financial Resource Strain: Low Risk  (06/16/2022)   Received from [facility], [facility]   Overall Financial Resource Strain (CARDIA)    Difficulty of Paying Living Expenses: Not hard at all  Food Insecurity: No Food Insecurity (06/16/2022)   Received from [facility], [facility]   Hunger Vital Sign    Worried About Running Out of Food in the Last Year: Never true    Ran Out of Food in the Last Year: Never true  Transportation Needs: No Transportation Needs (06/16/2022)   Received from [facility], [facility]   PRAPARE - Transportation    Lack of Transportation (Medical): No    Lack of Transportation (Non-Medical): No  Physical Activity: Not on file  Stress: No Stress Concern Present (10/30/2021)  Received from [employer], [facility]   [employer] of Occupational [facility] - Occupational Stress Questionnaire    Feeling of Stress : Not at all  Social Connections: Unknown (10/28/2021)   Received from [facility], [facility]   Social Network    Social Network: Not on file    His Allergies Are:  No Known Allergies:   His Current Medications Are:  Outpatient Encounter Medications as of 10/12/2022  Medication Sig   amLODipine (NORVASC) 10 MG tablet Take 1 tablet (10 mg total) by mouth daily.   aspirin 81 MG tablet Take 1 tablet (81 mg total) by mouth daily.   atorvastatin (LIPITOR) 80 MG tablet Take 80 mg by mouth daily.   escitalopram (LEXAPRO) 5 MG tablet Take 5 mg by mouth  daily.   losartan (COZAAR) 50 MG tablet Take 50 mg by mouth daily.   metoprolol tartrate (LOPRESSOR) 100 MG tablet Take 1 tablet (100 mg total) by mouth 2 (two) times daily.   Multiple Vitamin (MULTIVITAMIN) capsule Take 1 capsule by mouth daily.     PRESCRIPTION MEDICATION Cholesterol mediation, does not recall name   Omega-3 Fatty Acids (FISH OIL PO) Take by mouth. 3 daily.   No facility-administered encounter medications on file as of 10/12/2022.  :   Review of Systems:  Out of a complete 14 point review of systems, all are reviewed and negative with the exception of these symptoms as listed below:  Review of Systems  Neurological:        Pt here for sleep consult Pt hypertension Pt denies snoring,headaches,fatigue ,sleep study, CPAP machine   ESS FSS     Objective:  Neurological Exam  Physical Exam Physical Examination:   Vitals:   10/12/22 0900  BP: 139/80  Pulse: 60    General Examination: The patient is a very pleasant 67 y.o. male in no acute distress. He appears well-developed and well-nourished and well groomed.   HEENT: Normocephalic, atraumatic, pupils are equal, round and reactive to light, extraocular tracking is good without limitation to gaze excursion or nystagmus noted. Hearing is grossly intact. Face is symmetric with normal facial animation. Speech is with mild slowness and intermittent slight dysarthria noted.  There is no lip, neck/head, jaw or voice tremor. Neck is supple with full range of passive and active motion. There are no carotid bruits on auscultation. Oropharynx exam reveals: mild mouth dryness, adequate dental hygiene and moderate airway crowding, due to Mallampati class II, tonsillar size of 1-2+ on the right and 1+ on the left, slightly wider tongue noted.  Tongue protrudes centrally and palate elevates symmetrically.  Neck circumference 16-1/2 inches.  Chest: Clear to auscultation without wheezing, rhonchi or crackles noted.  Heart: S1+S2+0,  regular and normal without murmurs, rubs or gallops noted.   Abdomen: Soft, non-tender and non-distended.  Extremities: There is no obvious swelling in the distal lower extremities bilaterally.   Skin: Warm and dry without trophic changes noted.   Musculoskeletal: exam reveals no obvious joint deformities.   Neurologically:  Mental status: The patient is awake, alert and oriented in all 4 spheres. His immediate and remote memory, attention, language skills and fund of knowledge are appropriate. There is no evidence of aphasia, agnosia, apraxia or anomia. Speech as above. Thought process is linear. Mood is normal and affect is normal.  Cranial nerves II - XII are as described above under HEENT exam.  Motor exam: Normal bulk, strength and tone is noted. There is no obvious action or resting  tremor.  Fine motor skills and coordination: grossly intact.  Cerebellar testing: No dysmetria or intention tremor. There is no truncal or gait ataxia.  Sensory exam: intact to light touch in the upper and lower extremities.  Gait, station and balance: He stands easily. No veering to one side is noted. No leaning to one side is noted. Posture is age-appropriate.  He walks with a slight limp on the right.  No walking aid.  Assessment and Plan:  In summary, Annan Lemarr is a very pleasant 67 y.o.-year old male with an underlying medical history of left MCA stroke in September 2023 with status post thrombectomy, left M1 occlusion, diabetes, hypertension, hyperlipidemia, history of MI, and mild obesity, whose history and physical exam are concerning for sleep disordered breathing, particularly obstructive sleep apnea (OSA). A laboratory attended sleep study is typically considered "gold standard" for evaluation of sleep disordered breathing.   I had a long chat with the patient about my findings and the diagnosis of sleep apnea, particularly OSA, its prognosis and treatment options. We talked about  medical/conservative treatments, surgical interventions and non-pharmacological approaches for symptom control. I explained, in particular, the risks and ramifications of untreated moderate to severe OSA, especially with respect to developing cardiovascular disease down the road, including congestive heart failure (CHF), difficult to treat hypertension, cardiac arrhythmias (particularly A-fib), neurovascular complications including TIA, stroke and dementia. Even type 2 diabetes has, in part, been linked to untreated OSA. Symptoms of untreated OSA may include (but may not be limited to) daytime sleepiness, nocturia (i.e. frequent nighttime urination), memory problems, mood irritability and suboptimally controlled or worsening mood disorder such as depression and/or anxiety, lack of energy, lack of motivation, physical discomfort, as well as recurrent headaches, especially morning or nocturnal headaches. We talked about the importance of maintaining a healthy lifestyle and striving for healthy weight.  I recommended a sleep study at this time. I outlined the differences between a laboratory attended sleep study which is considered more comprehensive and accurate over the option of a home sleep test (HST); the latter may lead to underestimation of sleep disordered breathing in some instances and does not help with diagnosing upper airway resistance syndrome and is not accurate enough to diagnose primary central sleep apnea typically. I outlined possible surgical and non-surgical treatment options of OSA, including the use of a positive airway pressure (PAP) device (i.e. CPAP, AutoPAP/APAP or BiPAP in certain circumstances), a custom-made dental device (aka oral appliance, which would require a referral to a specialist dentist or orthodontist typically, and is generally speaking not considered for patients with full dentures or edentulous state), upper airway surgical options, such as traditional UPPP (which is not  considered a first-line treatment) or the Inspire device (hypoglossal nerve stimulator, which would involve a referral for consultation with an ENT surgeon, after careful selection, following inclusion criteria - also not first-line treatment). I explained the PAP treatment option to the patient in detail, as this is generally considered first-line treatment.  The patient indicated that he would be willing to try PAP therapy, if the need arises. I explained the importance of being compliant with PAP treatment, not only for insurance purposes but primarily to improve patient's symptoms symptoms, and for the patient's long term health benefit, including to reduce His cardiovascular risks longer-term.    We will pick up our discussion about the next steps and treatment options after testing.  We will keep him posted as to the test results by phone call and/or MyChart messaging  where possible.  We will plan to follow-up in sleep clinic accordingly as well.  I answered all his questions today and the patient was in agreement.   I encouraged him to call with any interim questions, concerns, problems or updates or email [place] through MyChart.  Generally speaking, sleep test authorizations may take up to 2 weeks, sometimes less, sometimes longer, the patient is encouraged to get in touch with [place] if they do not hear back from the sleep lab staff directly within the next 2 weeks.  Thank you very much for allowing me to participate in the care of this nice patient. If I can be of any further assistance to you please do not hesitate to call me at [phone].  Sincerely,   Huston Foley, MD, PhD

## 2022-10-12 NOTE — Patient Instructions (Signed)

## 2022-11-14 ENCOUNTER — Telehealth: Payer: Self-pay | Admitting: Neurology

## 2022-11-14 NOTE — Telephone Encounter (Signed)
[facility] Meagan lvm on 10/18/22  10/17/22 [employer] auth: [id number] (exp. 10/17/22 to 02/07/23) & medicaid no auth req EE

## 2022-11-27 ENCOUNTER — Other Ambulatory Visit: Payer: Self-pay

## 2022-11-27 ENCOUNTER — Encounter (HOSPITAL_COMMUNITY): Payer: Self-pay

## 2022-11-27 ENCOUNTER — Emergency Department (HOSPITAL_COMMUNITY)
Admission: EM | Admit: 2022-11-27 | Discharge: 2022-11-27 | Disposition: A | Discharge status: Home / Self Care | Payer: Medicare HMO | Attending: Emergency Medicine | Admitting: Emergency Medicine

## 2022-11-27 ENCOUNTER — Emergency Department (HOSPITAL_COMMUNITY): Payer: Medicare HMO

## 2022-11-27 DIAGNOSIS — Z7982 Long term (current) use of aspirin: Secondary | ICD-10-CM | POA: Insufficient documentation

## 2022-11-27 DIAGNOSIS — Z79899 Other long term (current) drug therapy: Secondary | ICD-10-CM | POA: Diagnosis not present

## 2022-11-27 DIAGNOSIS — E860 Dehydration: Secondary | ICD-10-CM | POA: Diagnosis not present

## 2022-11-27 DIAGNOSIS — I10 Essential (primary) hypertension: Secondary | ICD-10-CM | POA: Insufficient documentation

## 2022-11-27 DIAGNOSIS — E119 Type 2 diabetes mellitus without complications: Secondary | ICD-10-CM | POA: Diagnosis not present

## 2022-11-27 DIAGNOSIS — R55 Syncope and collapse: Secondary | ICD-10-CM | POA: Diagnosis present

## 2022-11-27 LAB — CBC WITH DIFFERENTIAL/PLATELET
Abs Immature Granulocytes: 0.02 10*3/uL (ref 0.00–0.07)
Basophils Absolute: 0.1 10*3/uL (ref 0.0–0.1)
Basophils Relative: 1 %
Eosinophils Absolute: 0.1 10*3/uL (ref 0.0–0.5)
Eosinophils Relative: 2 %
HCT: 38.4 % — ABNORMAL LOW (ref 39.0–52.0)
Hemoglobin: 12.2 g/dL — ABNORMAL LOW (ref 13.0–17.0)
Immature Granulocytes: 0 %
Lymphocytes Relative: 30 %
Lymphs Abs: 1.5 10*3/uL (ref 0.7–4.0)
MCH: 26.5 pg (ref 26.0–34.0)
MCHC: 31.8 g/dL (ref 30.0–36.0)
MCV: 83.5 fL (ref 80.0–100.0)
Monocytes Absolute: 0.5 10*3/uL (ref 0.1–1.0)
Monocytes Relative: 10 %
Neutro Abs: 2.7 10*3/uL (ref 1.7–7.7)
Neutrophils Relative %: 57 %
Platelets: 220 10*3/uL (ref 150–400)
RBC: 4.6 MIL/uL (ref 4.22–5.81)
RDW: 14.6 % (ref 11.5–15.5)
WBC: 4.8 10*3/uL (ref 4.0–10.5)
nRBC: 0 % (ref 0.0–0.2)

## 2022-11-27 LAB — BASIC METABOLIC PANEL
Anion gap: 9 (ref 5–15)
BUN: 26 mg/dL — ABNORMAL HIGH (ref 8–23)
CO2: 25 mmol/L (ref 22–32)
Calcium: 8.9 mg/dL (ref 8.9–10.3)
Chloride: 104 mmol/L (ref 98–111)
Creatinine, Ser: 1.94 mg/dL — ABNORMAL HIGH (ref 0.61–1.24)
GFR, Estimated: 38 mL/min — ABNORMAL LOW (ref >60)
Glucose, Bld: 182 mg/dL — ABNORMAL HIGH (ref 70–99)
Potassium: 3.5 mmol/L (ref 3.5–5.1)
Sodium: 138 mmol/L (ref 135–145)

## 2022-11-27 LAB — CBG MONITORING, ED: Glucose-Capillary: 168 mg/dL — ABNORMAL HIGH (ref 70–99)

## 2022-11-27 LAB — TROPONIN I (HIGH SENSITIVITY): Troponin I (High Sensitivity): 6 ng/L (ref <18)

## 2022-11-27 MED ORDER — SODIUM CHLORIDE 0.9 % IV BOLUS
500.0000 mL | Freq: Once | INTRAVENOUS | Status: AC
  Administered 2022-11-27: 500 mL via INTRAVENOUS

## 2022-11-27 NOTE — ED Provider Notes (Signed)
[facility] EMERGENCY DEPARTMENT AT [facility] Provider Note   CSN: [id number] Arrival date & time: 11/27/22  1347     History  Chief Complaint  Patient presents with   Loss of Consciousness    Juan Kelly is a 67 y.o. male.  67 year old male with history of left MCA stroke in 2023 status post thrombectomy, hypertension, hyperlipidemia, and diabetes who presents to the emergency department with syncopal event.  Patient was outdoors in a parking lot for several hours for a religious event.  Friends noticed that he started to get weak and helped him to the ground then he lost consciousness.  Had orthostatic vitals when EMS arrived and his blood pressure dropped to 80 systolic.  Patient thinks that this is because he has not eaten or drank today.  Says that he did not have any preceding symptoms.  Denies any headache, nausea, vomiting, chest pain, or shortness of breath.  200 mL of NS given by EMS en route.  Patient says he has taken all his blood pressure medicine today.       Home Medications Prior to Admission medications   Medication Sig Start Date End Date Taking? Authorizing Provider  amLODipine (NORVASC) 10 MG tablet Take 1 tablet (10 mg total) by mouth daily. 11/13/19   Mirian Mo, MD  aspirin 81 MG tablet Take 1 tablet (81 mg total) by mouth daily. 11/09/14   Ardith Dark, MD  atorvastatin (LIPITOR) 80 MG tablet Take 80 mg by mouth daily.    [provider]  escitalopram (LEXAPRO) 5 MG tablet Take 5 mg by mouth daily.    [provider]  losartan (COZAAR) 50 MG tablet Take 50 mg by mouth daily.    [provider]  metoprolol tartrate (LOPRESSOR) 100 MG tablet Take 1 tablet (100 mg total) by mouth 2 (two) times daily. 11/13/19   Mirian Mo, MD  Multiple Vitamin (MULTIVITAMIN) capsule Take 1 capsule by mouth daily.      [provider]  Omega-3 Fatty Acids (FISH OIL PO) Take by mouth. 3 daily.    [provider]   PRESCRIPTION MEDICATION Cholesterol mediation, does not recall name    [provider]      Allergies    Patient has no known allergies.    Review of Systems   Review of Systems  Physical Exam Updated Vital Signs BP (!) 166/94   Pulse (!) 56   Temp 97.9 F (36.6 C) (Axillary)   Resp 16   Ht 5\' 9"  (1.753 m)   Wt 101.2 kg   SpO2 100%   BMI 32.93 kg/m  Physical Exam Vitals and nursing note reviewed.  Constitutional:      General: He is not in acute distress.    Appearance: He is well-developed.  HENT:     Head: Normocephalic and atraumatic.     Right Ear: External ear normal.     Left Ear: External ear normal.     Nose: Nose normal.  Eyes:     Extraocular Movements: Extraocular movements intact.     Conjunctiva/sclera: Conjunctivae normal.     Pupils: Pupils are equal, round, and reactive to light.  Cardiovascular:     Rate and Rhythm: Normal rate and regular rhythm.     Heart sounds: Normal heart sounds.  Pulmonary:     Effort: Pulmonary effort is normal. No respiratory distress.     Breath sounds: Normal breath sounds.  Abdominal:     General: There  is no distension.     Palpations: Abdomen is soft. There is no mass.     Tenderness: There is no abdominal tenderness. There is no guarding.  Musculoskeletal:     Cervical back: Normal range of motion and neck supple.     Right lower leg: No edema.     Left lower leg: No edema.  Skin:    General: Skin is warm and dry.  Neurological:     Mental Status: He is alert and oriented to person, place, and time. Mental status is at baseline.     Sensory: No sensory deficit.     Motor: No weakness.  Psychiatric:        Mood and Affect: Mood normal.        Behavior: Behavior normal.     ED Results / Procedures / Treatments   Labs (all labs ordered are listed, but only abnormal results are displayed) Labs Reviewed  BASIC METABOLIC PANEL - Abnormal; Notable for the following components:      Result Value    Glucose, Bld 182 (*)    BUN 26 (*)    Creatinine, Ser 1.94 (*)    GFR, Estimated 38 (*)    All other components within normal limits  CBC WITH DIFFERENTIAL/PLATELET - Abnormal; Notable for the following components:   Hemoglobin 12.2 (*)    HCT 38.4 (*)    All other components within normal limits  CBG MONITORING, ED - Abnormal; Notable for the following components:   Glucose-Capillary 168 (*)    All other components within normal limits  TROPONIN I (HIGH SENSITIVITY)    EKG EKG Interpretation Date/Time:  Monday November 27 2022 13:56:19 EDT Ventricular Rate:  55 PR Interval:  233 QRS Duration:  170 QT Interval:  497 QTC Calculation: 476 R Axis:   263  Text Interpretation: Sinus rhythm Prolonged PR interval RBBB and LAFB Confirmed by Vonita Moss [phone]) on 11/27/2022 3:41:45 PM  Radiology DG Chest Portable 1 View  Result Date: 11/27/2022 CLINICAL DATA:  Syncopal episode today. EXAM: PORTABLE CHEST 1 VIEW COMPARISON:  12/04/2021 FINDINGS: Mild cardiomegaly. Tortuous aorta. The lungs are clear. The vascularity is normal. No infiltrate, collapse or effusion. No abnormal bone finding. IMPRESSION: No active disease. Mild cardiomegaly. Tortuous aorta. Electronically Signed   By: Paulina Fusi M.D.   On: 11/27/2022 15:51    Procedures Procedures    Medications Ordered in ED Medications  sodium chloride 0.9 % bolus 500 mL (0 mLs Intravenous Stopped 11/27/22 1809)  sodium chloride 0.9 % bolus 500 mL (0 mLs Intravenous Stopped 11/27/22 1916)    ED Course/ Medical Decision Making/ A&P Clinical Course as of 11/27/22 2010  Mon Nov 27, 2022  1434 Glucose-Capillary(!): 168 [RP]  1727 Creatinine(!): 1.94 Baseline of 2 in Feb 2024 [RP]    Clinical Course User Index [RP] Rondel Baton, MD                                 Medical Decision Making Amount and/or Complexity of Data Reviewed Labs: ordered. Decision-making details documented in ED Course. Radiology:  ordered.   Juan Kelly is a 67 y.o. male with comorbidities that complicate the patient evaluation including left MCA stroke in 2023 status post thrombectomy, hypertension, hyperlipidemia, and diabetes who presents to the emergency department with syncopal event.  Initial Ddx:  Syncope, arrhythmia, orthostasis, dehydration, hypoglycemia  MDM/Course:  Patient presents emergency department after a  syncopal episode.  Patient reports not having eaten today.  On exam is overall well-appearing.  Blood pressure is actually elevated at this point in time though per EMS was hypotensive.  No head strike and he was lowered to the ground.  EKG showed bundle branch block that was previously present.  No other concerning features such as Brugada, long QT, WPW.  Troponin was normal.  Lab work showed CKD that has been present previously.  Was given a liter of fluids and is feeling much better.  Performed shared decision making with the patient regarding admission versus discharge given his age and risk factors.  He stated that he preferred to go home and follow-up with his outpatient primary doctor and cardiologist.  Referral was placed.  Return precautions discussed prior to discharge.   This patient presents to the ED for concern of complaints listed in HPI, this involves an extensive number of treatment options, and is a complaint that carries with it a high risk of complications and morbidity. Disposition including potential need for admission considered.   Dispo: DC Home. Return precautions discussed including, but not limited to, those listed in the AVS. Allowed pt time to ask questions which were answered fully prior to dc.  Records reviewed Outpatient Clinic Notes The following labs were independently interpreted: Chemistry and show CKD I independently reviewed the following imaging with scope of interpretation limited to determining acute life threatening conditions related to emergency care: Chest x-ray  and agree with the radiologist interpretation with the following exceptions: none I personally reviewed and interpreted cardiac monitoring: normal sinus rhythm  I personally reviewed and interpreted the pt's EKG: see above for interpretation  I have reviewed the patients home medications and made adjustments as needed Social Determinants of health:  Elderly  Portions of this note were generated with Scientist, clinical (histocompatibility and immunogenetics). Dictation errors may occur despite best attempts at proofreading.           Final Clinical Impression(s) / ED Diagnoses Final diagnoses:  Syncope and collapse  Dehydration    Rx / DC Orders ED Discharge Orders          Ordered    Ambulatory referral to Cardiology        11/27/22 1958              Rondel Baton, MD 11/27/22 2010

## 2022-11-27 NOTE — Discharge Instructions (Signed)
You were seen for your fainting in the emergency department.   At home, please stay well hydrated.    Follow-up with your primary doctor in 2-3 days regarding your visit.  Cardiology will be calling you regarding an appointment within the next 72 hours.  You may contact them if you do not hear from them in that time using the information in this packet.  Return immediately to the emergency department if you experience any of the following: recurrent fainting, chest pain, difficulty breathing, unexplained vomiting or sweating, or any other concerning symptoms.    Thank you for visiting our Emergency Department. It was a pleasure taking care of you today.

## 2022-11-27 NOTE — ED Triage Notes (Signed)
Coming from side of the road. He has Hx of stroke, high BP, syncopal event from standing outside today. Friends and wall helped him to the floor. Stated he was completely out. Pale cool diaphoretic. Orthostatic dropped to 80. Did not eat or drink today. 18 LAC. 200 NS given. 4 mg Zofran. 12 lead unremarkable. CBG 169.

## 2023-04-11 ENCOUNTER — Other Ambulatory Visit: Payer: Self-pay | Admitting: Nephrology

## 2023-04-11 DIAGNOSIS — N1831 Chronic kidney disease, stage 3a: Secondary | ICD-10-CM

## 2023-04-13 ENCOUNTER — Other Ambulatory Visit: Payer: Medicare HMO

## 2023-04-13 ENCOUNTER — Ambulatory Visit
Admission: RE | Admit: 2023-04-13 | Discharge: 2023-04-13 | Disposition: A | Discharge status: Home / Self Care | Payer: Medicare HMO | Source: Ambulatory Visit | Attending: Nephrology | Admitting: Nephrology

## 2023-04-13 DIAGNOSIS — N1831 Chronic kidney disease, stage 3a: Secondary | ICD-10-CM

## 2023-10-02 ENCOUNTER — Telehealth: Payer: Self-pay | Admitting: Neurology

## 2023-10-02 NOTE — Telephone Encounter (Signed)
 Called the pt to make aware that I received a surgical clearance form that is indicating we need to clear. The patient states that he has not had any new symptoms or concerns indicating concern for new stroke. He continues the asa 81 mg daily. His primary care is managing care of medications etc. Advised the patient that we normally recommend holding asa 81 mg 3 days prior to procedure but considering the PCP is managing they will need to actually sign off on holding asa.  Advised I would have the work in provider sign from neurology standpoint pt is cleared from us . Pt verbalized understanding.

## 2023-10-02 NOTE — Telephone Encounter (Addendum)
 Clearance signed by work in, faxed back with last OVN. Confirmation received.

## 2023-12-17 ENCOUNTER — Encounter (HOSPITAL_COMMUNITY): Admission: RE | Admit: 2023-12-17 | Source: Ambulatory Visit

## 2023-12-24 ENCOUNTER — Ambulatory Visit (HOSPITAL_COMMUNITY): Admit: 2023-12-24 | Admitting: Orthopedic Surgery

## 2023-12-24 SURGERY — ARTHROPLASTY, KNEE, TOTAL
Anesthesia: Choice | Site: Knee | Laterality: Left
